# Patient Record
Sex: Male | Born: 1975
Health system: Southern US, Community
[De-identification: ages and names within clinical notes are randomized; demographics above are authoritative.]

## PROBLEM LIST (undated history)

## (undated) DIAGNOSIS — F17200 Nicotine dependence, unspecified, uncomplicated: Secondary | ICD-10-CM

## (undated) DIAGNOSIS — G4733 Obstructive sleep apnea (adult) (pediatric): Secondary | ICD-10-CM

## (undated) DIAGNOSIS — R5383 Other fatigue: Secondary | ICD-10-CM

## (undated) DIAGNOSIS — K219 Gastro-esophageal reflux disease without esophagitis: Secondary | ICD-10-CM

## (undated) DIAGNOSIS — I10 Essential (primary) hypertension: Secondary | ICD-10-CM

## (undated) DIAGNOSIS — R945 Abnormal results of liver function studies: Secondary | ICD-10-CM

## (undated) DIAGNOSIS — E785 Hyperlipidemia, unspecified: Secondary | ICD-10-CM

## (undated) DIAGNOSIS — D649 Anemia, unspecified: Secondary | ICD-10-CM

## (undated) HISTORY — DX: Nicotine dependence, unspecified, uncomplicated: F17.200

## (undated) HISTORY — DX: Anemia, unspecified: D64.9

## (undated) HISTORY — DX: Other fatigue: R53.83

## (undated) HISTORY — DX: Essential (primary) hypertension: I10

## (undated) HISTORY — DX: Gastro-esophageal reflux disease without esophagitis: K21.9

## (undated) HISTORY — DX: Hyperlipidemia, unspecified: E78.5

## (undated) HISTORY — PX: OTHER SURGICAL HISTORY: SHX169

## (undated) HISTORY — DX: Abnormal results of liver function studies: R94.5

## (undated) HISTORY — DX: Obstructive sleep apnea (adult) (pediatric): G47.33

---

## 2015-08-27 DIAGNOSIS — R945 Abnormal results of liver function studies: Secondary | ICD-10-CM | POA: Insufficient documentation

## 2015-08-27 DIAGNOSIS — R7989 Other specified abnormal findings of blood chemistry: Secondary | ICD-10-CM

## 2015-08-27 HISTORY — DX: Other specified abnormal findings of blood chemistry: R79.89

## 2015-08-27 HISTORY — DX: Abnormal results of liver function studies: R94.5

## 2015-09-17 DIAGNOSIS — G4733 Obstructive sleep apnea (adult) (pediatric): Secondary | ICD-10-CM | POA: Insufficient documentation

## 2015-09-17 DIAGNOSIS — Z9989 Dependence on other enabling machines and devices: Secondary | ICD-10-CM

## 2015-09-17 HISTORY — DX: Obstructive sleep apnea (adult) (pediatric): G47.33

## 2016-09-18 DIAGNOSIS — H6123 Impacted cerumen, bilateral: Secondary | ICD-10-CM | POA: Diagnosis not present

## 2016-09-18 DIAGNOSIS — J310 Chronic rhinitis: Secondary | ICD-10-CM | POA: Diagnosis not present

## 2016-09-25 ENCOUNTER — Encounter: Payer: Self-pay | Admitting: Family Medicine

## 2016-09-25 ENCOUNTER — Ambulatory Visit (INDEPENDENT_AMBULATORY_CARE_PROVIDER_SITE_OTHER): Payer: BLUE CROSS/BLUE SHIELD | Admitting: Family Medicine

## 2016-09-25 VITALS — BP 120/80 | HR 100 | Resp 12 | Ht 71.5 in | Wt 258.1 lb

## 2016-09-25 DIAGNOSIS — R739 Hyperglycemia, unspecified: Secondary | ICD-10-CM | POA: Diagnosis not present

## 2016-09-25 DIAGNOSIS — R740 Nonspecific elevation of levels of transaminase and lactic acid dehydrogenase [LDH]: Secondary | ICD-10-CM | POA: Diagnosis not present

## 2016-09-25 DIAGNOSIS — F17200 Nicotine dependence, unspecified, uncomplicated: Secondary | ICD-10-CM

## 2016-09-25 DIAGNOSIS — J309 Allergic rhinitis, unspecified: Secondary | ICD-10-CM | POA: Diagnosis not present

## 2016-09-25 DIAGNOSIS — E782 Mixed hyperlipidemia: Secondary | ICD-10-CM

## 2016-09-25 DIAGNOSIS — K219 Gastro-esophageal reflux disease without esophagitis: Secondary | ICD-10-CM

## 2016-09-25 DIAGNOSIS — I10 Essential (primary) hypertension: Secondary | ICD-10-CM

## 2016-09-25 DIAGNOSIS — R7989 Other specified abnormal findings of blood chemistry: Secondary | ICD-10-CM

## 2016-09-25 DIAGNOSIS — G4733 Obstructive sleep apnea (adult) (pediatric): Secondary | ICD-10-CM

## 2016-09-25 DIAGNOSIS — Z9989 Dependence on other enabling machines and devices: Secondary | ICD-10-CM

## 2016-09-25 DIAGNOSIS — R7401 Elevation of levels of liver transaminase levels: Secondary | ICD-10-CM

## 2016-09-25 DIAGNOSIS — M542 Cervicalgia: Secondary | ICD-10-CM

## 2016-09-25 DIAGNOSIS — M79661 Pain in right lower leg: Secondary | ICD-10-CM | POA: Diagnosis not present

## 2016-09-25 DIAGNOSIS — E049 Nontoxic goiter, unspecified: Secondary | ICD-10-CM

## 2016-09-25 HISTORY — DX: Allergic rhinitis, unspecified: J30.9

## 2016-09-25 HISTORY — DX: Mixed hyperlipidemia: E78.2

## 2016-09-25 HISTORY — DX: Cervicalgia: M54.2

## 2016-09-25 HISTORY — DX: Essential (primary) hypertension: I10

## 2016-09-25 HISTORY — DX: Elevation of levels of liver transaminase levels: R74.01

## 2016-09-25 HISTORY — DX: Morbid (severe) obesity due to excess calories: E66.01

## 2016-09-25 MED ORDER — METOPROLOL SUCCINATE ER 25 MG PO TB24: 25.0000 mg | ORAL_TABLET | Freq: Every day | ORAL | 2 refills | Status: DC

## 2016-09-25 MED ORDER — NICOTINE 21 MG/24HR TD PT24: 21.0000 mg | MEDICATED_PATCH | Freq: Every day | TRANSDERMAL | 1 refills | Status: DC

## 2016-09-25 MED ORDER — FENOFIBRATE MICRONIZED 200 MG PO CAPS: 200.0000 mg | ORAL_CAPSULE | Freq: Every day | ORAL | 0 refills | Status: DC

## 2016-09-25 MED ORDER — AMLODIPINE BESYLATE 5 MG PO TABS: 5.0000 mg | ORAL_TABLET | Freq: Every day | ORAL | 2 refills | Status: DC

## 2016-09-25 NOTE — Progress Notes (Signed)
Pre visit review using our clinic review tool, if applicable. No additional management support is needed unless otherwise documented below in the visit note. 

## 2016-09-25 NOTE — Progress Notes (Signed)
HPI:   [name] is a 41 y.o. male, who is here today to establish care.  Former PCP: Dr Sherryll Burger Last preventive routine visit: 1-2 years ago.  Chronic medical problems: Cervicalgia, GERD, ED,OSA,HTN,HLD, tobacco use disorder.  He has followed with urologist for ED, he takes Cialis 5 mg daily as needed, he takes it very seldom. He has followed up with urologists annually, he has periodic prostate check due to family history of prostate cancer. He is planning on establishing with urologist here in town. He denies dysuria, urinary frequency, or gross hematuria.   Concerns today:   Since yesterday left sided sore throat. He denies fever, chills, body aches. No sick contact. He has history of allergic rhinitis, has followed with ENT, currently he is on Flonase nasal spray.  Right calf pain:  2-3 months of right lateral calf pain usually after 10 min of walking, alleviated by rest. Pain is described as cramp, moderate intensity. He denies numbness, tingling, or cold extremities. He denies lower back pain.   He mentions right cervical pain for the past 2-3 years, exacerbated by falling asleep on the couch with head in certain position,mild,no radiated.He takes Celebrex as needed.   Hyperlipidemia:  Elevated triglycerides. Currently on fenofibrate 200 mg daily and Vascepa 2 g bid. TG 02/2016 were 365. Following a low fat diet: Not consistently,started yesterday again.  He has not noted side effects with medication.  History of elevated transaminases, in March 2017 AST 46 and AST 69. Work up negative for hepatitis A, B, and C. He states that this problem "runs in the family",parents with Hx of hepatitis C. Father has been treated already,mother dies from complications.  Glucose 08/2015 116 (121), he denies history of diabetes.  Hypertension:   Dx 2017. Currently on Amlodipine 5 mg and Metoprolol succinate are 25 mg daily  He wonders if he needs to continue  medications. He is not checking BP at home.  He istaking medications as instructed, no side effects reported.  He has not noted unusual headache, visual changes, exertional chest pain, dyspnea,  focal weakness, or edema.  + Smoker. He tried Chantix about a year ago, resume smoking after completing treatment.  He is interested in trying to quit again.  Hx of OSA with CPAP machine. Last sleep study 08/2015 (Dx) 09/2015 (CPAP titration).   Review of Systems  Constitutional: Negative for activity change, appetite change, fatigue, fever and unexpected weight change.  HENT: Positive for postnasal drip and sore throat. Negative for dental problem, nosebleeds and trouble swallowing.   Eyes: Negative for redness and visual disturbance.  Respiratory: Positive for apnea. Negative for cough, shortness of breath and wheezing.   Cardiovascular: Negative for chest pain, palpitations and leg swelling.  Gastrointestinal: Negative for abdominal pain, nausea and vomiting.       No changes in bowel habits.  Endocrine: Negative for cold intolerance, heat intolerance, polydipsia, polyphagia and polyuria.  Genitourinary: Negative for decreased urine volume, dysuria and hematuria.  Musculoskeletal: Positive for myalgias. Negative for arthralgias.  Skin: Negative for pallor and rash.  Allergic/Immunologic: Positive for environmental allergies.  Neurological: Negative for syncope, weakness, numbness and headaches.  Psychiatric/Behavioral: Negative for confusion. The patient is not nervous/anxious.     No current outpatient prescriptions on file prior to visit.   No current facility-administered medications on file prior to visit.     Past Medical History:  Diagnosis Date  . Abnormal LFTs (liver function tests) 08/27/2015   Hepatitis work-up  negative 08/2015.  Marland Kitchen Anemia    Thalasemia  . Fatigue   . GERD (gastroesophageal reflux disease)   . Hyperlipidemia    dyslipidemia-hyperTG  . Hypertension   .  OSA (obstructive sleep apnea)    last sleep study 09/2015  . Tobacco use disorder    Allergies  Allergen Reactions  . Oxycodone     Family History  Problem Relation Age of Onset  . Cancer Father     prostate  . Hepatitis C Father   . Hepatitis C Mother     Social History   Social History  . Marital status: Married    Spouse name: N/A  . Number of children: N/A  . Years of education: N/A   Social History Main Topics  . Smoking status: Current Every Day Smoker  . Smokeless tobacco: Never Used  . Alcohol use No  . Drug use: No  . Sexual activity: Not Asked   Other Topics Concern  . None   Social History Narrative  . None    Vitals:   09/25/16 1446  BP: 120/80  Pulse: 100  Resp: 12  O2 sat at RA 97% Body mass index is 35.5 kg/m.   Physical Exam  Nursing note and vitals reviewed. Constitutional: He is oriented to person, place, and time. He appears well-developed. No distress.  HENT:  Head: Atraumatic.  Mouth/Throat: Uvula is midline, oropharynx is clear and moist and mucous membranes are normal.  Postnasal drainage.  Eyes: Conjunctivae and EOM are normal. Pupils are equal, round, and reactive to light.  Neck: No tracheal deviation present. Thyromegaly (? Nodular) present. No thyroid mass present.  Cardiovascular: Normal rate and regular rhythm.   No murmur heard. DP and PT pulses present.  Respiratory: Effort normal and breath sounds normal. No respiratory distress.  GI: Soft. He exhibits no mass. There is no hepatomegaly. There is no tenderness.  Musculoskeletal: He exhibits no edema or tenderness.       Cervical back: He exhibits normal range of motion, no tenderness and no bony tenderness.       Right lower leg: He exhibits no tenderness.       Left lower leg: He exhibits no tenderness.  Lymphadenopathy:    He has no cervical adenopathy.  Neurological: He is alert and oriented to person, place, and time. He has normal strength. Gait normal.  Skin:  Skin is warm. No rash noted. No erythema.  Psychiatric: His mood appears anxious. Cognition and memory are normal.  Well groomed, good eye contact.     ASSESSMENT AND PLAN:   Nakia was seen today for establish care.  Diagnoses and all orders for this visit:  Gastroesophageal reflux disease, esophagitis presence not specified  Well controlled. Some side effects of PPI discussed. GERD precautions review. He can try PPI as needed.  Hypertension, essential, benign Adequately controlled. No changes in current management for now. DASH-low salt diet recommended. Eye exam recommended annually. F/U in 5-6 months, before if needed.   -     amLODipine (NORVASC) 5 MG tablet; Take 1 tablet (5 mg total) by mouth daily. -     metoprolol succinate (TOPROL-XL) 25 MG 24 hr tablet; Take 1 tablet (25 mg total) by mouth daily. -     Comprehensive metabolic panel; Future  Right calf pain  We discussed possible etiologies: Muscle cramp,vein disease,PAD. Distal pulses present, because history associated claudication ABI will be arranged. Smoking cessation encouraged. Instructed about warning signs.  -  VAS [place] ABI WITH/WO TBI; Future  Enlarged thyroid gland  ? Nodular thyroid. Asymptomatic. TSH 08/2015 in normal range. Further recommendations would be given according to imaging results.  -     TSH; Future -     US THYROID; Future  Hyperlipemia, mixed  No changes in current management, will follow labs and will give further recommendations accordingly. He is not fasting ,so will come back tomorrow for fasting labs. We discussed some side effects and benefits of current medications. F/U in 6-12 months.  -     fenofibrate micronized (LOFIBRA) 200 MG capsule; Take 1 capsule (200 mg total) by mouth daily before breakfast. -     Comprehensive metabolic panel; Future -     Lipid panel; Future  Elevated transaminase level  ? Fatty liver (NASH) Healthy diet and weight loss  recommended. Further recommendations will be given according to lab results.  -     Comprehensive metabolic panel; Future  OSA on CPAP  According to patient CPAP supplies are guaranteed for about another 4 years, his sleep specialist in [facility] receives information electronically and he gets feedback accordingly. So he prefers to hold on referral to pulmonologist.  Severe obesity (BMI 35.0-39.9) with comorbidity (HCC)  We discussed benefits of wt loss as well as adverse effects of obesity. Consistency with healthy diet and physical activity recommended. Daily brisk walking for 15-30 min as tolerated.  Allergic rhinitis, unspecified seasonality, unspecified trigger  Sore throat he is reporting today could be related to allergies and/or the beginning of her viral pharyngitis. Today examination is otherwise negative.  Continue current management. Follow up with ENT as needed.   Cervicalgia  Chronic. Currently he is asymptomatic. We discussed some side effects of chronic NSAIDs and use, including CV and GI.  Hyperglycemia  He is having labs tomorrow, further recommendations would be given according to lab results. We discussed the importance of healthy lifestyle for primary prevention.  -     Hemoglobin A1c; Future  Tobacco use disorder  We discussed some treatment options, he already failed Chantix. He could try nicotine: Patches or gum. Adverse effects of tobacco use discussed. Nicotine patches 21 mg daily recommended for 7 weeks.   Records reviewed during OV.   Betty G. [place], MD  [facility]. [place] office.

## 2016-09-25 NOTE — Patient Instructions (Signed)
A few things to remember from today's visit:   Right calf pain - Plan: VAS [place] ABI WITH/WO TBI  Hypertension, essential, benign - Plan: amLODipine (NORVASC) 5 MG tablet, metoprolol succinate (TOPROL-XL) 25 MG 24 hr tablet, Comprehensive metabolic panel  Enlarged thyroid gland - Plan: TSH, US THYROID  Hyperlipemia, mixed - Plan: Comprehensive metabolic panel, Lipid panel  Gastroesophageal reflux disease, esophagitis presence not specified  Elevated transaminase level  Blood pressure goal for most people is less than 140/90. Some populations (older than 60) the goal is less than 150/90.  Most recent cardiologists' recommendations recommend blood pressure at or less than 130/80.   Elevated blood pressure increases the risk of strokes, heart and kidney disease, and eye problems. Regular physical activity and a healthy diet (DASH diet) usually help. Low salt diet. Take medications as instructed.  Caution with some over the counter medications as cold medications, dietary products (for weight loss), and Ibuprofen or Aleve (frequent use);all these medications could cause elevation of blood pressure.   Please be sure medication list is accurate. If a new problem present, please set up appointment sooner than planned today.

## 2016-09-26 ENCOUNTER — Other Ambulatory Visit: Payer: BLUE CROSS/BLUE SHIELD

## 2016-09-26 LAB — COMPREHENSIVE METABOLIC PANEL
ALBUMIN: 4.2 g/dL (ref 3.5–5.2)
ALT: 102 U/L — ABNORMAL HIGH (ref 0–53)
AST: 66 U/L — AB (ref 0–37)
Alkaline Phosphatase: 64 U/L (ref 39–117)
BUN: 18 mg/dL (ref 6–23)
CO2: 27 mEq/L (ref 19–32)
Calcium: 9.6 mg/dL (ref 8.4–10.5)
Chloride: 103 mEq/L (ref 96–112)
Creatinine, Ser: 0.98 mg/dL (ref 0.40–1.50)
GFR: 89.25 mL/min (ref >60.00)
Glucose, Bld: 345 mg/dL — ABNORMAL HIGH (ref 70–99)
POTASSIUM: 4.2 meq/L (ref 3.5–5.1)
SODIUM: 137 meq/L (ref 135–145)
Total Bilirubin: 0.5 mg/dL (ref 0.2–1.2)
Total Protein: 6.8 g/dL (ref 6.0–8.3)

## 2016-09-26 LAB — LIPID PANEL
CHOL/HDL RATIO: 8
CHOLESTEROL: 164 mg/dL (ref 0–200)
HDL: 20.1 mg/dL — ABNORMAL LOW (ref >39.00)
Triglycerides: 446 mg/dL — ABNORMAL HIGH (ref 0.0–149.0)

## 2016-09-26 LAB — LDL CHOLESTEROL, DIRECT: Direct LDL: 95 mg/dL

## 2016-09-26 LAB — HEMOGLOBIN A1C: Hgb A1c MFr Bld: 8.8 % — ABNORMAL HIGH (ref 4.6–6.5)

## 2016-09-26 LAB — TSH: TSH: 1.93 u[IU]/mL (ref 0.35–4.50)

## 2016-09-26 NOTE — Addendum Note (Signed)
Addended by: Charna Elizabeth on: 09/26/2016 09:31 AM   Modules accepted: Orders

## 2016-10-03 ENCOUNTER — Other Ambulatory Visit: Payer: Self-pay

## 2016-10-03 MED ORDER — ROSUVASTATIN CALCIUM 20 MG PO TABS: 20.0000 mg | ORAL_TABLET | Freq: Every day | ORAL | 3 refills | Status: DC

## 2016-10-03 MED ORDER — METFORMIN HCL 500 MG PO TABS: ORAL_TABLET | ORAL | 3 refills | Status: DC

## 2016-10-06 ENCOUNTER — Ambulatory Visit
Admission: RE | Admit: 2016-10-06 | Discharge: 2016-10-06 | Disposition: A | Discharge status: Home / Self Care | Payer: BLUE CROSS/BLUE SHIELD | Source: Ambulatory Visit | Attending: Family Medicine | Admitting: Family Medicine

## 2016-10-06 ENCOUNTER — Telehealth: Payer: Self-pay | Admitting: Family Medicine

## 2016-10-06 ENCOUNTER — Other Ambulatory Visit: Payer: Self-pay

## 2016-10-06 DIAGNOSIS — E049 Nontoxic goiter, unspecified: Secondary | ICD-10-CM

## 2016-10-06 DIAGNOSIS — R748 Abnormal levels of other serum enzymes: Secondary | ICD-10-CM

## 2016-10-06 DIAGNOSIS — E0520 Thyrotoxicosis with toxic multinodular goiter without thyrotoxic crisis or storm: Secondary | ICD-10-CM | POA: Diagnosis not present

## 2016-10-06 NOTE — Addendum Note (Signed)
Addended by: Marcell AngerSELF, Carlei Huang E on: 10/06/2016 12:36 PM   Modules accepted: Orders

## 2016-10-06 NOTE — Telephone Encounter (Signed)
° ° ° °  Pt would like a call back concerning his labs. He wants to speak with someone I told him the labs was mail to his home but he is requesting a call back to discuss

## 2016-10-06 NOTE — Telephone Encounter (Signed)
Informed patient of results and patient verbalized understanding.  

## 2016-10-21 ENCOUNTER — Other Ambulatory Visit: Payer: Self-pay | Admitting: Family Medicine

## 2016-10-21 DIAGNOSIS — M79661 Pain in right lower leg: Secondary | ICD-10-CM

## 2016-10-24 ENCOUNTER — Inpatient Hospital Stay (HOSPITAL_COMMUNITY): Admission: RE | Admit: 2016-10-24 | Payer: BLUE CROSS/BLUE SHIELD | Source: Ambulatory Visit

## 2016-11-04 DIAGNOSIS — E119 Type 2 diabetes mellitus without complications: Secondary | ICD-10-CM | POA: Diagnosis not present

## 2016-11-04 DIAGNOSIS — K219 Gastro-esophageal reflux disease without esophagitis: Secondary | ICD-10-CM | POA: Diagnosis not present

## 2016-11-04 DIAGNOSIS — Z1389 Encounter for screening for other disorder: Secondary | ICD-10-CM | POA: Diagnosis not present

## 2016-11-04 DIAGNOSIS — I10 Essential (primary) hypertension: Secondary | ICD-10-CM | POA: Diagnosis not present

## 2016-11-04 DIAGNOSIS — E782 Mixed hyperlipidemia: Secondary | ICD-10-CM | POA: Diagnosis not present

## 2016-11-10 ENCOUNTER — Other Ambulatory Visit: Payer: Self-pay | Admitting: *Deleted

## 2016-11-10 MED ORDER — ROSUVASTATIN CALCIUM 20 MG PO TABS: 20.0000 mg | ORAL_TABLET | Freq: Every day | ORAL | 1 refills | Status: DC

## 2016-11-10 MED ORDER — METFORMIN HCL 500 MG PO TABS: ORAL_TABLET | ORAL | 1 refills | Status: DC

## 2016-11-10 NOTE — Telephone Encounter (Signed)
Rx done. 

## 2016-11-17 ENCOUNTER — Other Ambulatory Visit: Payer: BLUE CROSS/BLUE SHIELD

## 2016-12-22 DIAGNOSIS — E119 Type 2 diabetes mellitus without complications: Secondary | ICD-10-CM | POA: Diagnosis not present

## 2016-12-22 DIAGNOSIS — Z0000 Encounter for general adult medical examination without abnormal findings: Secondary | ICD-10-CM | POA: Diagnosis not present

## 2016-12-22 DIAGNOSIS — E782 Mixed hyperlipidemia: Secondary | ICD-10-CM | POA: Diagnosis not present

## 2017-01-20 DIAGNOSIS — D72829 Elevated white blood cell count, unspecified: Secondary | ICD-10-CM | POA: Diagnosis not present

## 2017-01-20 DIAGNOSIS — Z23 Encounter for immunization: Secondary | ICD-10-CM | POA: Diagnosis not present

## 2017-01-20 DIAGNOSIS — Z0000 Encounter for general adult medical examination without abnormal findings: Secondary | ICD-10-CM | POA: Diagnosis not present

## 2017-02-17 ENCOUNTER — Telehealth: Payer: Self-pay | Admitting: Oncology

## 2017-02-17 NOTE — Telephone Encounter (Signed)
Appt has been scheduled for the pt to see Dr. Clelia Croft on 9/20 at 11am. Pt aware to arrive 30 minutes early.

## 2017-02-19 ENCOUNTER — Telehealth: Payer: Self-pay | Admitting: Oncology

## 2017-02-19 ENCOUNTER — Ambulatory Visit (HOSPITAL_BASED_OUTPATIENT_CLINIC_OR_DEPARTMENT_OTHER): Payer: BLUE CROSS/BLUE SHIELD

## 2017-02-19 ENCOUNTER — Ambulatory Visit (HOSPITAL_BASED_OUTPATIENT_CLINIC_OR_DEPARTMENT_OTHER): Payer: BLUE CROSS/BLUE SHIELD | Admitting: Oncology

## 2017-02-19 VITALS — BP 133/85 | HR 89 | Temp 98.9°F | Resp 17 | Ht 71.5 in | Wt 228.0 lb

## 2017-02-19 DIAGNOSIS — D72829 Elevated white blood cell count, unspecified: Secondary | ICD-10-CM

## 2017-02-19 LAB — CBC WITH DIFFERENTIAL/PLATELET
BASO%: 0.3 % (ref 0.0–2.0)
BASOS ABS: 0 10*3/uL (ref 0.0–0.1)
EOS%: 2.3 % (ref 0.0–7.0)
Eosinophils Absolute: 0.3 10*3/uL (ref 0.0–0.5)
HEMATOCRIT: 40.6 % (ref 38.4–49.9)
HGB: 13.4 g/dL (ref 13.0–17.1)
LYMPH%: 22.5 % (ref 14.0–49.0)
MCH: 20.2 pg — AB (ref 27.2–33.4)
MCHC: 33 g/dL (ref 32.0–36.0)
MCV: 61.1 fL — AB (ref 79.3–98.0)
MONO#: 1.3 10*3/uL — AB (ref 0.1–0.9)
MONO%: 9.7 % (ref 0.0–14.0)
NEUT#: 8.6 10*3/uL — ABNORMAL HIGH (ref 1.5–6.5)
NEUT%: 65.2 % (ref 39.0–75.0)
PLATELETS: 205 10*3/uL (ref 140–400)
RBC: 6.65 10*6/uL — AB (ref 4.20–5.82)
RDW: 17.1 % — ABNORMAL HIGH (ref 11.0–14.6)
WBC: 13.1 10*3/uL — ABNORMAL HIGH (ref 4.0–10.3)
lymph#: 3 10*3/uL (ref 0.9–3.3)
nRBC: 0 % (ref 0–0)

## 2017-02-19 NOTE — Telephone Encounter (Signed)
Scheduled lab add on for today - no additional appts scheduled per 9/20 los -

## 2017-02-19 NOTE — Progress Notes (Signed)
Reason for Referral: Leukocytosis.   HPI: 41 year old gentleman native of [place] currently living in this area the last 8 months. He is a history of obesity, thalassemia and obstructive sleep apnea. He has also history of smoking for the last 20 years close to a pack a day. He currently works in [employer] and fairly busy in his job. He was evaluated by his primary care physician and laboratory testing at the time showed an elevated white cell count close to 12,000. His differential was normal at bedtime. His hemoglobin was normal with low MCV. This blood count was normal. He reports no specific symptoms at this time including no fevers or chills or sweats. He denied any lymphadenopathy. He denied any abdominal pain or loss of appetite. He is losing weight intentionally. He remains active attending to her activities of daily living.  He does not report any headaches, blurry vision, syncope or seizures. He does not report any fevers or chills or sweats. He does not report any cough, wheezing or hemoptysis. He does not report any nausea, vomiting or abdominal pain. He does not report any frequency urgency or hesitancy. He does not report any skeletal complaints. Remaining review of systems unremarkable.   Past Medical History:  Diagnosis Date  . Abnormal LFTs (liver function tests) 08/27/2015   Hepatitis work-up negative 08/2015.  Marland Kitchen Anemia    Thalasemia  . Fatigue   . GERD (gastroesophageal reflux disease)   . Hyperlipidemia    dyslipidemia-hyperTG  . Hypertension   . OSA (obstructive sleep apnea)    last sleep study 09/2015  . Tobacco use disorder   :  No past surgical history on file.:   Current Outpatient Prescriptions:  .  amLODipine (NORVASC) 5 MG tablet, Take 1 tablet (5 mg total) by mouth daily., Disp: 90 tablet, Rfl: 2 .  celecoxib (CELEBREX) 200 MG capsule, Take 200 mg by mouth daily as needed. Take 200 mg by mouth daily as needed., Disp: , Rfl: 9 .  CIALIS 5 MG  tablet, TAKE 1 TABLET BY MOUTH EVERY DAY . SEPARATE AT LEAST 4 HOURS FROM ALPHA BLOCKERS, Disp: , Rfl: 4 .  fluticasone (FLONASE) 50 MCG/ACT nasal spray, INSTILL 2 SPRAYS INTO EACH NOSTRIL ONCE EVERY NIGHT, Disp: , Rfl: 5 .  metFORMIN (GLUCOPHAGE) 500 MG tablet, Take 1 tablet by mouth with breakfast and take 2 tablets by mouth at supper., Disp: 270 tablet, Rfl: 1 .  metoprolol succinate (TOPROL-XL) 25 MG 24 hr tablet, Take 1 tablet (25 mg total) by mouth daily., Disp: 90 tablet, Rfl: 2 .  nicotine (NICODERM CQ - DOSED IN MG/24 HOURS) 21 mg/24hr patch, Place 1 patch (21 mg total) onto the skin daily., Disp: 28 patch, Rfl: 1 .  omeprazole (PRILOSEC) 40 MG capsule, Take 40 mg by mouth daily as needed. Take 40 mg by mouth daily as needed., Disp: , Rfl: 2 .  rosuvastatin (CRESTOR) 20 MG tablet, Take 1 tablet (20 mg total) by mouth daily., Disp: 90 tablet, Rfl: 1 .  VASCEPA 1 g CAPS, Take 2 capsules by mouth 2 (two) times daily. Take 2 capsules by mouth 2 (two) times daily., Disp: , Rfl: 3:  Allergies  Allergen Reactions  . Oxycodone   :  Family History  Problem Relation Age of Onset  . Cancer Father        prostate  . Hepatitis C Father   . Hepatitis C Mother   :  Social History   Social History  . Marital  status: Married    Spouse name: N/A  . Number of children: N/A  . Years of education: N/A   Occupational History  . Not on file.   Social History Main Topics  . Smoking status: Current Every Day Smoker  . Smokeless tobacco: Never Used  . Alcohol use No  . Drug use: No  . Sexual activity: Not on file   Other Topics Concern  . Not on file   Social History Narrative  . No narrative on file  :  Pertinent items are noted in HPI.  Exam: Blood pressure 133/85, pulse 89, temperature 98.9 F (37.2 C), temperature source Oral, resp. rate 17, height 5' 11.5" (1.816 m), weight 228 lb (103.4 kg), SpO2 100 %.  ECOG 0  General appearance: alert and cooperative appeared without  distress. Throat: No oral thrush or ulcers. Neck: no adenopathy.  Back: negative Resp: clear to auscultation bilaterally Chest wall: no tenderness Cardio: regular rate and rhythm, S1, S2 normal, no murmur, click, rub or gallop GI: soft, non-tender; bowel sounds normal; no masses,  no organomegaly daily. Extremities: extremities normal, atraumatic, no cyanosis or edema Pulses: 2+ and symmetric Skin: Skin color, texture, turgor normal. No rashes or lesions Lymph examination: Showed no lymphadenopathy.   Recent Labs  02/19/17 1154  WBC 13.1*  HGB 13.4  HCT 40.6  PLT 205     Assessment and Plan:   41 year old gentleman with the following issues:  1. Leukocytosis: His CBC was repeated today and showed a white cell count of 13,000. His differential is within normal range. His hemoglobin was 13.4 with a low MCV.  The differential diagnosis was discussed today. His leukocytosis appears to be reactive in nature. This could be related to smoking as the most likely cause. Other causes includes recent stress, infection among others.  Hematological disorder considered less likely. Chronic myelogenous leukemia will need to be ruled out which we will do so today. Other Myeloproliferative disorder is also considered less likely.  If his CML evaluation showed no evidence of BCR/ABL translocation, no further testing is needed at this time. There is no need for any imaging studies or a bone marrow biopsy. His white cell count elevation is rather mild and chronic in nature likely related to smoking.  2. Microcytosis: Appear to be related to thalassemia. No anemia noted. He is aware of these findings and implication about future children.  3. Smoking cessation: I advised him about the importance of smoking cessation not only because of his leukocytosis but other health implications.  4. Follow-up: If his evaluation showed no hematological abnormalities, no hematology follow-up is needed.

## 2017-02-24 DIAGNOSIS — IMO0001 Reserved for inherently not codable concepts without codable children: Secondary | ICD-10-CM | POA: Insufficient documentation

## 2017-02-24 DIAGNOSIS — E1165 Type 2 diabetes mellitus with hyperglycemia: Secondary | ICD-10-CM

## 2017-02-24 HISTORY — DX: Reserved for inherently not codable concepts without codable children: IMO0001

## 2017-02-24 NOTE — Progress Notes (Deleted)
HPI:   [name] is a 41 y.o. male, who is here today to follow on some chronic medical problems.  He was last seen on 09/25/16.  Hyperlipidemia:  Currently on Fenofibrate 200 mg daily. Following a low fat diet: ***.  *** has not noted side effects with medication.  Lab Results  Component Value Date   CHOL 164 09/26/2016   HDL 20.10 (L) 09/26/2016   LDLDIRECT 95.0 09/26/2016   TRIG (H) 09/26/2016    446.0 Triglyceride is over 400; calculations on Lipids are invalid.   CHOLHDL 8 09/26/2016    Elevated transaminases.  Lab Results  Component Value Date   ALT 102 (H) 09/26/2016   AST 66 (H) 09/26/2016   ALKPHOS 64 09/26/2016   BILITOT 0.5 09/26/2016    HTN: Dx in 2017. On Amlodipine 5 mg and Metoprolol Succinate 25 mg daily. Home BP's: *** Last eye exam: *** Denies severe/frequent headache, visual changes, chest pain, dyspnea, palpitation, claudication, focal weakness, or edema.  OSA on CPAP.  He was c/o calf pain last OV, missed appt for ABI.  Diabetes Mellitus II:   Dx 08/2016. Metformin 500 mg bid recommended.  Checking BS's : *** Hypoglycemia:  *** is tolerating medications well. *** denies abdominal pain, nausea, vomiting, polydipsia, polyuria, or polyphagia. ***numbness, tingling, or burning.   Lab Results  Component Value Date   CREATININE 0.98 09/26/2016   BUN 18 09/26/2016   NA 137 09/26/2016   K 4.2 09/26/2016   CL 103 09/26/2016   CO2 27 09/26/2016    Lab Results  Component Value Date   HGBA1C 8.8 (H) 09/26/2016    Concerns today: ***   Review of Systems    Current Outpatient Prescriptions on File Prior to Visit  Medication Sig Dispense Refill  . amLODipine (NORVASC) 5 MG tablet Take 1 tablet (5 mg total) by mouth daily. 90 tablet 2  . celecoxib (CELEBREX) 200 MG capsule Take 200 mg by mouth daily as needed. Take 200 mg by mouth daily as needed.  9  . CIALIS 5 MG tablet TAKE 1 TABLET BY MOUTH EVERY DAY .  SEPARATE AT LEAST 4 HOURS FROM ALPHA BLOCKERS  4  . fluticasone (FLONASE) 50 MCG/ACT nasal spray INSTILL 2 SPRAYS INTO EACH NOSTRIL ONCE EVERY NIGHT  5  . metFORMIN (GLUCOPHAGE) 500 MG tablet Take 1 tablet by mouth with breakfast and take 2 tablets by mouth at supper. 270 tablet 1  . metoprolol succinate (TOPROL-XL) 25 MG 24 hr tablet Take 1 tablet (25 mg total) by mouth daily. 90 tablet 2  . nicotine (NICODERM CQ - DOSED IN MG/24 HOURS) 21 mg/24hr patch Place 1 patch (21 mg total) onto the skin daily. 28 patch 1  . omeprazole (PRILOSEC) 40 MG capsule Take 40 mg by mouth daily as needed. Take 40 mg by mouth daily as needed.  2  . rosuvastatin (CRESTOR) 20 MG tablet Take 1 tablet (20 mg total) by mouth daily. 90 tablet 1  . VASCEPA 1 g CAPS Take 2 capsules by mouth 2 (two) times daily. Take 2 capsules by mouth 2 (two) times daily.  3   No current facility-administered medications on file prior to visit.      Past Medical History:  Diagnosis Date  . Abnormal LFTs (liver function tests) 08/27/2015   Hepatitis work-up negative 08/2015.  Marland Kitchen Anemia    Thalasemia  . Fatigue   . GERD (gastroesophageal reflux disease)   . Hyperlipidemia  dyslipidemia-hyperTG  . Hypertension   . OSA (obstructive sleep apnea)    last sleep study 09/2015  . Tobacco use disorder    Allergies  Allergen Reactions  . Oxycodone     Social History   Social History  . Marital status: Married    Spouse name: N/A  . Number of children: N/A  . Years of education: N/A   Social History Main Topics  . Smoking status: Current Every Day Smoker  . Smokeless tobacco: Never Used  . Alcohol use No  . Drug use: No  . Sexual activity: Not on file   Other Topics Concern  . Not on file   Social History Narrative  . No narrative on file    There were no vitals filed for this visit. There is no height or weight on file to calculate BMI.      Physical Exam    ASSESSMENT AND PLAN:     Diagnoses and all  orders for this visit:  Elevated transaminase level  Hypertension, essential, benign  Severe obesity (BMI 35.0-39.9) with comorbidity (HCC)  Hyperlipemia, mixed  Uncontrolled type 2 diabetes mellitus without complication, without long-term current use of insulin [facility])           -[name] was advised to return sooner than planned today if new concerns arise.       Gerald Barrett G. [place], MD  [facility]. [place] office.

## 2017-02-25 ENCOUNTER — Ambulatory Visit: Payer: BLUE CROSS/BLUE SHIELD | Admitting: Family Medicine

## 2017-02-25 DIAGNOSIS — Z0289 Encounter for other administrative examinations: Secondary | ICD-10-CM

## 2017-02-26 ENCOUNTER — Encounter: Payer: Self-pay | Admitting: *Deleted

## 2017-04-01 DIAGNOSIS — R05 Cough: Secondary | ICD-10-CM | POA: Diagnosis not present

## 2017-04-01 DIAGNOSIS — J209 Acute bronchitis, unspecified: Secondary | ICD-10-CM | POA: Diagnosis not present

## 2017-04-01 DIAGNOSIS — L0390 Cellulitis, unspecified: Secondary | ICD-10-CM | POA: Diagnosis not present

## 2017-04-01 DIAGNOSIS — L02416 Cutaneous abscess of left lower limb: Secondary | ICD-10-CM | POA: Diagnosis not present

## 2017-07-01 ENCOUNTER — Other Ambulatory Visit: Payer: Self-pay | Admitting: *Deleted

## 2017-07-01 MED ORDER — METFORMIN HCL 500 MG PO TABS: ORAL_TABLET | ORAL | 1 refills | Status: DC

## 2017-07-03 ENCOUNTER — Other Ambulatory Visit: Payer: Self-pay | Admitting: Family Medicine

## 2017-08-12 DIAGNOSIS — I10 Essential (primary) hypertension: Secondary | ICD-10-CM | POA: Diagnosis not present

## 2017-08-12 DIAGNOSIS — Z1389 Encounter for screening for other disorder: Secondary | ICD-10-CM | POA: Diagnosis not present

## 2017-08-12 DIAGNOSIS — D72829 Elevated white blood cell count, unspecified: Secondary | ICD-10-CM | POA: Diagnosis not present

## 2017-08-12 DIAGNOSIS — E119 Type 2 diabetes mellitus without complications: Secondary | ICD-10-CM | POA: Diagnosis not present

## 2017-08-12 DIAGNOSIS — F17210 Nicotine dependence, cigarettes, uncomplicated: Secondary | ICD-10-CM | POA: Diagnosis not present

## 2017-10-05 ENCOUNTER — Other Ambulatory Visit: Payer: Self-pay | Admitting: Family Medicine

## 2018-01-04 ENCOUNTER — Other Ambulatory Visit: Payer: Self-pay | Admitting: Family Medicine

## 2018-01-27 DIAGNOSIS — I10 Essential (primary) hypertension: Secondary | ICD-10-CM | POA: Diagnosis not present

## 2018-01-27 DIAGNOSIS — Z0000 Encounter for general adult medical examination without abnormal findings: Secondary | ICD-10-CM | POA: Diagnosis not present

## 2018-01-27 DIAGNOSIS — G4733 Obstructive sleep apnea (adult) (pediatric): Secondary | ICD-10-CM | POA: Diagnosis not present

## 2018-01-27 DIAGNOSIS — E1169 Type 2 diabetes mellitus with other specified complication: Secondary | ICD-10-CM | POA: Diagnosis not present

## 2018-01-27 DIAGNOSIS — E782 Mixed hyperlipidemia: Secondary | ICD-10-CM | POA: Diagnosis not present

## 2018-01-27 DIAGNOSIS — Z1389 Encounter for screening for other disorder: Secondary | ICD-10-CM | POA: Diagnosis not present

## 2018-03-18 DIAGNOSIS — L810 Postinflammatory hyperpigmentation: Secondary | ICD-10-CM | POA: Diagnosis not present

## 2018-03-18 DIAGNOSIS — D171 Benign lipomatous neoplasm of skin and subcutaneous tissue of trunk: Secondary | ICD-10-CM | POA: Diagnosis not present

## 2018-03-18 DIAGNOSIS — L304 Erythema intertrigo: Secondary | ICD-10-CM | POA: Diagnosis not present

## 2018-03-18 DIAGNOSIS — L723 Sebaceous cyst: Secondary | ICD-10-CM | POA: Diagnosis not present

## 2018-07-02 ENCOUNTER — Other Ambulatory Visit: Payer: Self-pay | Admitting: *Deleted

## 2018-07-02 ENCOUNTER — Other Ambulatory Visit: Payer: Self-pay | Admitting: Family Medicine

## 2018-07-26 DIAGNOSIS — J0140 Acute pansinusitis, unspecified: Secondary | ICD-10-CM | POA: Diagnosis not present

## 2018-07-29 DIAGNOSIS — G4733 Obstructive sleep apnea (adult) (pediatric): Secondary | ICD-10-CM | POA: Diagnosis not present

## 2018-08-03 DIAGNOSIS — K219 Gastro-esophageal reflux disease without esophagitis: Secondary | ICD-10-CM | POA: Diagnosis not present

## 2018-08-03 DIAGNOSIS — E782 Mixed hyperlipidemia: Secondary | ICD-10-CM | POA: Diagnosis not present

## 2018-08-03 DIAGNOSIS — E1169 Type 2 diabetes mellitus with other specified complication: Secondary | ICD-10-CM | POA: Diagnosis not present

## 2018-08-03 DIAGNOSIS — G4733 Obstructive sleep apnea (adult) (pediatric): Secondary | ICD-10-CM | POA: Diagnosis not present

## 2018-08-03 DIAGNOSIS — I10 Essential (primary) hypertension: Secondary | ICD-10-CM | POA: Diagnosis not present

## 2018-09-07 ENCOUNTER — Telehealth: Payer: Self-pay | Admitting: *Deleted

## 2018-09-07 NOTE — Telephone Encounter (Signed)
Patient has not been seen 08/2016 for DM. Called patient to schedule DM f/u appointment. No answer. LVM.

## 2018-09-21 DIAGNOSIS — Z3141 Encounter for fertility testing: Secondary | ICD-10-CM | POA: Diagnosis not present

## 2018-09-25 ENCOUNTER — Other Ambulatory Visit: Payer: Self-pay | Admitting: Family Medicine

## 2018-09-27 NOTE — Telephone Encounter (Signed)
It seems like I have not seen this pt since 2018. Thanks, BJ

## 2018-09-27 NOTE — Telephone Encounter (Signed)
Left message to schedule virtual visit with PCP for follow-up for medication refills.

## 2018-10-05 DIAGNOSIS — M546 Pain in thoracic spine: Secondary | ICD-10-CM | POA: Diagnosis not present

## 2018-10-05 DIAGNOSIS — M25512 Pain in left shoulder: Secondary | ICD-10-CM | POA: Diagnosis not present

## 2018-10-13 DIAGNOSIS — Z3141 Encounter for fertility testing: Secondary | ICD-10-CM | POA: Diagnosis not present

## 2018-11-04 DIAGNOSIS — R5383 Other fatigue: Secondary | ICD-10-CM | POA: Diagnosis not present

## 2019-03-26 ENCOUNTER — Encounter (INDEPENDENT_AMBULATORY_CARE_PROVIDER_SITE_OTHER): Payer: Self-pay

## 2019-08-04 ENCOUNTER — Ambulatory Visit: Payer: BLUE CROSS/BLUE SHIELD | Attending: Family

## 2019-08-04 DIAGNOSIS — Z23 Encounter for immunization: Secondary | ICD-10-CM | POA: Insufficient documentation

## 2019-08-04 NOTE — Progress Notes (Signed)
   Covid-19 Vaccination Clinic  Name:  Gerald Barrett    MRN: [id number] DOB: 11-Feb-1976  08/04/2019  [name] was observed post Covid-19 immunization for 15 minutes without incident. He was provided with Vaccine Information Sheet and instruction to access the V-Safe system.   [name] was instructed to call 911 with any severe reactions post vaccine: Marland Kitchen Difficulty breathing  . Swelling of face and throat  . A fast heartbeat  . A bad rash all over body  . Dizziness and weakness   Immunizations Administered    Name Date Dose VIS Date Route   Moderna COVID-19 Vaccine 08/04/2019  1:06 PM 0.5 mL 05/03/2019 Intramuscular   Manufacturer: Moderna   Lot: [id number]   NDC: 42395-320-23

## 2019-08-09 DIAGNOSIS — I10 Essential (primary) hypertension: Secondary | ICD-10-CM | POA: Diagnosis not present

## 2019-08-09 DIAGNOSIS — G4733 Obstructive sleep apnea (adult) (pediatric): Secondary | ICD-10-CM | POA: Diagnosis not present

## 2019-08-09 DIAGNOSIS — E782 Mixed hyperlipidemia: Secondary | ICD-10-CM | POA: Diagnosis not present

## 2019-08-09 DIAGNOSIS — E119 Type 2 diabetes mellitus without complications: Secondary | ICD-10-CM | POA: Diagnosis not present

## 2019-08-09 DIAGNOSIS — E1169 Type 2 diabetes mellitus with other specified complication: Secondary | ICD-10-CM | POA: Diagnosis not present

## 2019-08-12 DIAGNOSIS — B974 Respiratory syncytial virus as the cause of diseases classified elsewhere: Secondary | ICD-10-CM | POA: Diagnosis not present

## 2019-08-12 DIAGNOSIS — J101 Influenza due to other identified influenza virus with other respiratory manifestations: Secondary | ICD-10-CM | POA: Diagnosis not present

## 2019-08-12 DIAGNOSIS — Z20828 Contact with and (suspected) exposure to other viral communicable diseases: Secondary | ICD-10-CM | POA: Diagnosis not present

## 2019-08-12 DIAGNOSIS — U071 COVID-19: Secondary | ICD-10-CM | POA: Diagnosis not present

## 2019-08-25 DIAGNOSIS — N529 Male erectile dysfunction, unspecified: Secondary | ICD-10-CM | POA: Diagnosis not present

## 2019-09-06 ENCOUNTER — Ambulatory Visit: Payer: Self-pay | Attending: Family

## 2019-09-06 DIAGNOSIS — Z23 Encounter for immunization: Secondary | ICD-10-CM

## 2019-09-06 NOTE — Progress Notes (Signed)
   Covid-19 Vaccination Clinic  Name:  Gerald Barrett    MRN: [id number] DOB: [birth year]  09/06/2019  [name] was observed post Covid-19 immunization for 15 minutes without incident. He was provided with Vaccine Information Sheet and instruction to access the V-Safe system.   Mr. Rounds was instructed to call 911 with any severe reactions post vaccine: Marland Kitchen Difficulty breathing  . Swelling of face and throat  . A fast heartbeat  . A bad rash all over body  . Dizziness and weakness   Immunizations Administered    Name Date Dose VIS Date Route   Moderna COVID-19 Vaccine 09/06/2019 12:48 PM 0.5 mL 05/03/2019 Intramuscular   Manufacturer: Moderna   Lot: [id number]   NDC: 75883-254-98

## 2019-10-18 ENCOUNTER — Other Ambulatory Visit: Payer: Self-pay

## 2019-10-19 ENCOUNTER — Encounter: Payer: Self-pay | Admitting: Cardiology

## 2019-10-19 ENCOUNTER — Other Ambulatory Visit: Payer: Self-pay

## 2019-10-19 ENCOUNTER — Ambulatory Visit: Payer: BC Managed Care – PPO | Admitting: Cardiology

## 2019-10-19 VITALS — BP 122/90 | HR 94 | Ht 71.5 in | Wt 251.0 lb

## 2019-10-19 DIAGNOSIS — F17200 Nicotine dependence, unspecified, uncomplicated: Secondary | ICD-10-CM

## 2019-10-19 DIAGNOSIS — I10 Essential (primary) hypertension: Secondary | ICD-10-CM | POA: Diagnosis not present

## 2019-10-19 DIAGNOSIS — E782 Mixed hyperlipidemia: Secondary | ICD-10-CM

## 2019-10-19 NOTE — Patient Instructions (Signed)
Medication Instructions:  Your physician recommends that you continue on your current medications as directed. Please refer to the Current Medication list given to you today.  *If you need a refill on your cardiac medications before your next appointment, please call your pharmacy*   Lab Work: None. If you have labs (blood work) drawn today and your tests are completely normal, you will receive your results only by: . MyChart Message (if you have MyChart) OR . A paper copy in the mail If you have any lab test that is abnormal or we need to change your treatment, we will call you to review the results.   Testing/Procedures: Your physician has requested that you have an echocardiogram. Echocardiography is a painless test that uses sound waves to create images of your heart. It provides your doctor with information about the size and shape of your heart and how well your heart's chambers and valves are working. This procedure takes approximately one hour. There are no restrictions for this procedure.     Follow-Up: At [facility], you and your health needs are our priority.  As part of our continuing mission to provide you with exceptional heart care, we have created designated Provider Care Teams.  These Care Teams include your primary Cardiologist (physician) and Advanced Practice Providers (APPs -  Physician Assistants and Nurse Practitioners) who all work together to provide you with the care you need, when you need it.  We recommend signing up for the patient portal called "MyChart".  Sign up information is provided on this After Visit Summary.  MyChart is used to connect with patients for Virtual Visits (Telemedicine).  Patients are able to view lab/test results, encounter notes, upcoming appointments, etc.  Non-urgent messages can be sent to your provider as well.   To learn more about what you can do with MyChart, go to [email or web address].    Your next appointment:   3  month(s)  The format for your next appointment:   In Person  Provider:   Robert Krasowski, MD   Other Instructions   Echocardiogram An echocardiogram is a procedure that uses painless sound waves (ultrasound) to produce an image of the heart. Images from an echocardiogram can provide important information about:  Signs of coronary artery disease (CAD).  Aneurysm detection. An aneurysm is a weak or damaged part of an artery wall that bulges out from the normal force of blood pumping through the body.  Heart size and shape. Changes in the size or shape of the heart can be associated with certain conditions, including heart failure, aneurysm, and CAD.  Heart muscle function.  Heart valve function.  Signs of a past heart attack.  Fluid buildup around the heart.  Thickening of the heart muscle.  A tumor or infectious growth around the heart valves. Tell a health care provider about:  Any allergies you have.  All medicines you are taking, including vitamins, herbs, eye drops, creams, and over-the-counter medicines.  Any blood disorders you have.  Any surgeries you have had.  Any medical conditions you have.  Whether you are pregnant or may be pregnant. What are the risks? Generally, this is a safe procedure. However, problems may occur, including:  Allergic reaction to dye (contrast) that may be used during the procedure. What happens before the procedure? No specific preparation is needed. You may eat and drink normally. What happens during the procedure?   An IV tube may be inserted into one of your veins.  You may receive   contrast through this tube. A contrast is an injection that improves the quality of the pictures from your heart.  A gel will be applied to your chest.  A wand-like tool (transducer) will be moved over your chest. The gel will help to transmit the sound waves from the transducer.  The sound waves will harmlessly bounce off of your heart to  allow the heart images to be captured in real-time motion. The images will be recorded on a computer. The procedure may vary among health care providers and hospitals. What happens after the procedure?  You may return to your normal, everyday life, including diet, activities, and medicines, unless your health care provider tells you not to do that. Summary  An echocardiogram is a procedure that uses painless sound waves (ultrasound) to produce an image of the heart.  Images from an echocardiogram can provide important information about the size and shape of your heart, heart muscle function, heart valve function, and fluid buildup around your heart.  You do not need to do anything to prepare before this procedure. You may eat and drink normally.  After the echocardiogram is completed, you may return to your normal, everyday life, unless your health care provider tells you not to do that. This information is not intended to replace advice given to you by your health care provider. Make sure you discuss any questions you have with your health care provider. Document Revised: 09/09/2018 Document Reviewed: 06/21/2016 Elsevier Patient Education  2020 Elsevier Inc.   

## 2019-10-19 NOTE — Progress Notes (Signed)
Cardiology Consultation:    Date:  10/19/2019   ID:  Gerald Barrett, DOB 10/04/1975, MRN [id number]  PCP:  Wenda Low, MD  Cardiologist:  Jenne Campus, MD   Referring MD: Wenda Low, MD   Chief Complaint  Patient presents with  . New Patient (Initial Visit)  I would like to be checked out  History of Present Illness:    Gerald Barrett is a 44 y.o. male who is being seen today for the evaluation of multiple risk factors for coronary disease at the request of Wenda Low, MD.  I met this gentleman last week when I saw his wife.  He is from [place] and he was interpreting for his wife.  We started talking about multiple risk factors for coronary artery disease and he decided to come to be establish as a patient to talk about those risks.  He denies have any chest pain tightness squeezing pressure burning chest.  He does have some exertional shortness of breath.  But he does have multiple risk factors and does include smoking which is still ongoing.  He also have diabetes which is fairly controlled.  He does have essential hypertension.  He does not exercise on the regular basis.  Therefore, we spent majority of visit talking about risk factors modification that he can take to prevent him to have problem in the future. He does have family history of premature coronary artery disease Risk factors are mentioning above. Luckily it looks like he does not have any symptoms right now.  But at the same time he does not exercise on the regular basis.  Past Medical History:  Diagnosis Date  . Abnormal LFTs (liver function tests) 08/27/2015   Hepatitis work-up negative 08/2015.  Marland Kitchen Allergic rhinitis 09/25/2016  . Anemia    Thalasemia  . Cervicalgia 09/25/2016  . Diabetes mellitus type 2, uncontrolled, without complications 01/31/5175  . Elevated transaminase level 09/25/2016  . Fatigue   . GERD (gastroesophageal reflux disease)   . Hyperlipemia, mixed 09/25/2016  . Hyperlipidemia    dyslipidemia-hyperTG  . Hypertension   . Hypertension, essential, benign 09/25/2016  . OSA (obstructive sleep apnea)    last sleep study 09/2015  . OSA on CPAP 09/17/2015  . Severe obesity (BMI 35.0-39.9) with comorbidity ([place]) 09/25/2016  . Tobacco use disorder     History reviewed. No pertinent surgical history.  Current Medications: Current Meds  Medication Sig  . amLODipine-benazepril (LOTREL) 5-10 MG capsule Take 1 capsule by mouth daily.  . celecoxib (CELEBREX) 200 MG capsule Take 200 mg by mouth daily as needed. Take 200 mg by mouth daily as needed.  Marland Kitchen CIALIS 5 MG tablet TAKE 1 TABLET BY MOUTH EVERY DAY . SEPARATE AT LEAST 4 HOURS FROM ALPHA BLOCKERS  . FLUoxetine (PROZAC) 10 MG capsule Take 10 mg by mouth daily.  . metFORMIN (GLUCOPHAGE) 500 MG tablet TAKE 1 TABLET BY MOUTH WITH BREAKFAST AND TAKE 2 TABLETS BY MOUTH AT SUPPER.  Marland Kitchen omeprazole (PRILOSEC) 40 MG capsule Take 40 mg by mouth daily as needed. Take 40 mg by mouth daily as needed.  . rosuvastatin (CRESTOR) 20 MG tablet TAKE 1 TABLET BY MOUTH EVERY DAY     Allergies:   Oxycodone   Social History   Socioeconomic History  . Marital status: Married    Spouse name: Not on file  . Number of children: Not on file  . Years of education: Not on file  . Highest education level: Not on file  Occupational History  .  Not on file  Tobacco Use  . Smoking status: Current Every Day Smoker  . Smokeless tobacco: Never Used  Substance and Sexual Activity  . Alcohol use: No  . Drug use: No  . Sexual activity: Not on file  Other Topics Concern  . Not on file  Social History Narrative  . Not on file   Social Determinants of Health   Financial Resource Strain:   . Difficulty of Paying Living Expenses:   Food Insecurity:   . Worried About Charity fundraiser in the Last Year:   . Arboriculturist in the Last Year:   Transportation Needs:   . Film/video editor (Medical):   Marland Kitchen Lack of Transportation (Non-Medical):     Physical Activity:   . Days of Exercise per Week:   . Minutes of Exercise per Session:   Stress:   . Feeling of Stress :   Social Connections:   . Frequency of Communication with Friends and Family:   . Frequency of Social Gatherings with Friends and Family:   . Attends Religious Services:   . Active Member of Clubs or Organizations:   . Attends Archivist Meetings:   Marland Kitchen Marital Status:      Family History: The patient's family history includes Cancer in his father; Hepatitis C in his father and mother. ROS:   Please see the history of present illness.    All 14 point review of systems negative except as described per history of present illness.  EKGs/Labs/Other Studies Reviewed:    The following studies were reviewed today:   EKG:  EKG is  ordered today.  The ekg ordered today demonstrates normal sinus rhythm, left axis deviation, no ST segment changes.  Recent Labs: No results found for requested labs within last 8760 hours.  Recent Lipid Panel    Component Value Date/Time   CHOL 164 09/26/2016 0931   TRIG (H) 09/26/2016 0931    446.0 Triglyceride is over 400; calculations on Lipids are invalid.   HDL 20.10 (L) 09/26/2016 0931   CHOLHDL 8 09/26/2016 0931   LDLDIRECT 95.0 09/26/2016 0931    Physical Exam:    VS:  BP 122/90   Pulse 94   Ht 5' 11.5" (1.816 m)   Wt 251 lb (113.9 kg)   SpO2 97%   BMI 34.52 kg/m     Wt Readings from Last 3 Encounters:  10/19/19 251 lb (113.9 kg)  02/19/17 228 lb (103.4 kg)  09/25/16 258 lb 2 oz (117.1 kg)     GEN:  Well nourished, well developed in no acute distress HEENT: Normal NECK: No JVD; No carotid bruits LYMPHATICS: No lymphadenopathy CARDIAC: RRR, no murmurs, no rubs, no gallops RESPIRATORY:  Clear to auscultation without rales, wheezing or rhonchi  ABDOMEN: Soft, non-tender, non-distended MUSCULOSKELETAL:  No edema; No deformity  SKIN: Warm and dry NEUROLOGIC:  Alert and oriented x 3 PSYCHIATRIC:   Normal affect   ASSESSMENT:    1. Hypertension, essential, benign   2. Hyperlipemia, mixed   3. Severe obesity (BMI 35.0-39.9) with comorbidity ([place])   4. Tobacco use disorder    PLAN:    In order of problems listed above:  1. Multiple risk factors for coronary artery disease.  He is essential hypertension seems to be well managed.  We'll continue present medications and actually looking at the medication he does not do anything to reduce his blood pressure.  We'll continue monitoring an echocardiogram if blood pressure will  be elevated. 2. Dyslipidemia he is scheduled in September to see his primary care physician to redo his cholesterol will wait for it.  In the meantime he is on high intensity statin he is taking Kristen 20 mg which we'll continue. 3. Obesity we did talk about need to exercise as well as good diet he understand he will try to do that. 4. Smoking absolutely the biggest risk factor for his close clinical scenario I told him that smoking dabbled risk for coronary artery disease he understand he will try to do something about it he is vaping however I told him that vaping is also not good.  We spent a lot of time talking about risk factors modification about the need to exercise on the regular basis.  We talked about need to quit smoking I will ask him to have an echocardiogram done to assess left ventricle ejection fraction I will see him back in my office in about 3 months to see how he is progressing.  I think he will require a lot of coaching and encouragement.   Medication Adjustments/Labs and Tests Ordered: Current medicines are reviewed at length with the patient today.  Concerns regarding medicines are outlined above.  No orders of the defined types were placed in this encounter.  No orders of the defined types were placed in this encounter.   Signed, Park Liter, MD, [facility]. 10/19/2019 4:13 PM    [facility]

## 2019-10-24 ENCOUNTER — Other Ambulatory Visit: Payer: Self-pay

## 2019-10-24 ENCOUNTER — Ambulatory Visit (HOSPITAL_BASED_OUTPATIENT_CLINIC_OR_DEPARTMENT_OTHER)
Admission: RE | Admit: 2019-10-24 | Discharge: 2019-10-24 | Disposition: A | Discharge status: Home / Self Care | Payer: BC Managed Care – PPO | Source: Ambulatory Visit | Attending: Cardiology | Admitting: Cardiology

## 2019-10-24 DIAGNOSIS — E782 Mixed hyperlipidemia: Secondary | ICD-10-CM | POA: Diagnosis not present

## 2019-10-24 DIAGNOSIS — I10 Essential (primary) hypertension: Secondary | ICD-10-CM | POA: Diagnosis not present

## 2019-10-24 NOTE — Progress Notes (Signed)
  Echocardiogram 2D Echocardiogram has been performed.  Basel Defalco A Vonzell Lindblad 10/24/2019, 1:52 PM

## 2019-11-17 DIAGNOSIS — L02412 Cutaneous abscess of left axilla: Secondary | ICD-10-CM | POA: Diagnosis not present

## 2019-11-18 DIAGNOSIS — L02412 Cutaneous abscess of left axilla: Secondary | ICD-10-CM | POA: Diagnosis not present

## 2019-11-25 DIAGNOSIS — L02414 Cutaneous abscess of left upper limb: Secondary | ICD-10-CM | POA: Diagnosis not present

## 2019-11-25 DIAGNOSIS — L0390 Cellulitis, unspecified: Secondary | ICD-10-CM | POA: Diagnosis not present

## 2020-01-25 ENCOUNTER — Ambulatory Visit: Payer: BLUE CROSS/BLUE SHIELD | Admitting: Cardiology

## 2020-01-25 ENCOUNTER — Other Ambulatory Visit: Payer: Self-pay

## 2020-01-25 ENCOUNTER — Encounter: Payer: Self-pay | Admitting: Cardiology

## 2020-01-25 VITALS — BP 136/86 | HR 100 | Ht 72.0 in | Wt 256.0 lb

## 2020-01-25 DIAGNOSIS — G4733 Obstructive sleep apnea (adult) (pediatric): Secondary | ICD-10-CM | POA: Diagnosis not present

## 2020-01-25 DIAGNOSIS — E782 Mixed hyperlipidemia: Secondary | ICD-10-CM

## 2020-01-25 DIAGNOSIS — Z9989 Dependence on other enabling machines and devices: Secondary | ICD-10-CM

## 2020-01-25 DIAGNOSIS — I10 Essential (primary) hypertension: Secondary | ICD-10-CM | POA: Diagnosis not present

## 2020-01-25 NOTE — Patient Instructions (Signed)

## 2020-01-25 NOTE — Progress Notes (Signed)
Cardiology Office Note:    Date:  01/25/2020   ID:  Gerald Barrett, DOB [birth year], MRN [id number]  PCP:  Wenda Low, MD  Cardiologist:  Jenne Campus, MD    Referring MD: Wenda Low, MD   No chief complaint on file. Doing well  History of Present Illness:    Gerald Barrett is a 44 y.o. male   who is being seen today for the evaluation of multiple risk factors for coronary disease at the request of Wenda Low, MD.  I met this gentleman last week when I saw his wife.  He is from [place] and he was interpreting for his wife.  We started talking about multiple risk factors for coronary artery disease and he decided to come to be establish as a patient to talk about those risks.  He denies have any chest pain tightness squeezing pressure burning chest.  He does have some exertional shortness of breath.  But he does have multiple risk factors and does include smoking which is still ongoing.  He also have diabetes which is fairly controlled.  He does have essential hypertension.  He does not exercise on the regular basis.  Therefore, we spent majority of visit talking about risk factors modification that he can take to prevent him to have problem in the future. He does have family history of premature coronary artery disease  Comes today to my office for follow-up.  Overall doing well.  Denies of any chest pain tightness squeezing pressure burning chest.  He is changing his diet he does not eat carbohydrates anymore he lost some weight already and he is very happy with that.  It should also help with his cholesterol.  Past Medical History:  Diagnosis Date  . Abnormal LFTs (liver function tests) 08/27/2015   Hepatitis work-up negative 08/2015.  Marland Kitchen Allergic rhinitis 09/25/2016  . Anemia    Thalasemia  . Cervicalgia 09/25/2016  . Diabetes mellitus type 2, uncontrolled, without complications 6/33/3545  . Elevated transaminase level 09/25/2016  . Fatigue   . GERD (gastroesophageal reflux  disease)   . Hyperlipemia, mixed 09/25/2016  . Hyperlipidemia    dyslipidemia-hyperTG  . Hypertension   . Hypertension, essential, benign 09/25/2016  . OSA (obstructive sleep apnea)    last sleep study 09/2015  . OSA on CPAP 09/17/2015  . Severe obesity (BMI 35.0-39.9) with comorbidity ([place]) 09/25/2016  . Tobacco use disorder     History reviewed. No pertinent surgical history.  Current Medications: Current Meds  Medication Sig  . amLODipine-benazepril (LOTREL) 5-10 MG capsule Take 1 capsule by mouth daily.  . celecoxib (CELEBREX) 200 MG capsule Take 200 mg by mouth daily as needed. Take 200 mg by mouth daily as needed.  Marland Kitchen CIALIS 5 MG tablet TAKE 1 TABLET BY MOUTH EVERY DAY . SEPARATE AT LEAST 4 HOURS FROM ALPHA BLOCKERS  . FLUoxetine (PROZAC) 10 MG capsule Take 10 mg by mouth daily.  . metFORMIN (GLUCOPHAGE) 500 MG tablet TAKE 1 TABLET BY MOUTH WITH BREAKFAST AND TAKE 2 TABLETS BY MOUTH AT SUPPER.  Marland Kitchen omeprazole (PRILOSEC) 40 MG capsule Take 40 mg by mouth daily as needed. Take 40 mg by mouth daily as needed.  . rosuvastatin (CRESTOR) 20 MG tablet TAKE 1 TABLET BY MOUTH EVERY DAY     Allergies:   Oxycodone   Social History   Socioeconomic History  . Marital status: Married    Spouse name: Not on file  . Number of children: Not on file  . Years of  education: Not on file  . Highest education level: Not on file  Occupational History  . Not on file  Tobacco Use  . Smoking status: Current Every Day Smoker  . Smokeless tobacco: Never Used  Vaping Use  . Vaping Use: Every day  Substance and Sexual Activity  . Alcohol use: No  . Drug use: No  . Sexual activity: Not on file  Other Topics Concern  . Not on file  Social History Narrative  . Not on file   Social Determinants of Health   Financial Resource Strain:   . Difficulty of Paying Living Expenses: Not on file  Food Insecurity:   . Worried About Charity fundraiser in the Last Year: Not on file  . Ran Out of Food in  the Last Year: Not on file  Transportation Needs:   . Lack of Transportation (Medical): Not on file  . Lack of Transportation (Non-Medical): Not on file  Physical Activity:   . Days of Exercise per Week: Not on file  . Minutes of Exercise per Session: Not on file  Stress:   . Feeling of Stress : Not on file  Social Connections:   . Frequency of Communication with Friends and Family: Not on file  . Frequency of Social Gatherings with Friends and Family: Not on file  . Attends Religious Services: Not on file  . Active Member of Clubs or Organizations: Not on file  . Attends Archivist Meetings: Not on file  . Marital Status: Not on file     Family History: The patient's family history includes Cancer in his father; Hepatitis C in his father and mother. ROS:   Please see the history of present illness.    All 14 point review of systems negative except as described per history of present illness  EKGs/Labs/Other Studies Reviewed:      Recent Labs: No results found for requested labs within last 8760 hours.  Recent Lipid Panel    Component Value Date/Time   CHOL 164 09/26/2016 0931   TRIG (H) 09/26/2016 0931    446.0 Triglyceride is over 400; calculations on Lipids are invalid.   HDL 20.10 (L) 09/26/2016 0931   CHOLHDL 8 09/26/2016 0931   LDLDIRECT 95.0 09/26/2016 0931    Physical Exam:    VS:  BP 136/86   Pulse 100   Ht 6' (1.829 m)   Wt 256 lb (116.1 kg)   SpO2 97%   BMI 34.72 kg/m     Wt Readings from Last 3 Encounters:  01/25/20 256 lb (116.1 kg)  10/19/19 251 lb (113.9 kg)  02/19/17 228 lb (103.4 kg)     GEN:  Well nourished, well developed in no acute distress HEENT: Normal NECK: No JVD; No carotid bruits LYMPHATICS: No lymphadenopathy CARDIAC: RRR, no murmurs, no rubs, no gallops RESPIRATORY:  Clear to auscultation without rales, wheezing or rhonchi  ABDOMEN: Soft, non-tender, non-distended MUSCULOSKELETAL:  No edema; No deformity  SKIN:  Warm and dry LOWER EXTREMITIES: no swelling NEUROLOGIC:  Alert and oriented x 3 PSYCHIATRIC:  Normal affect   ASSESSMENT:    1. Hypertension, essential, benign   2. Hyperlipemia, mixed   3. OSA on CPAP    PLAN:    In order of problems listed above:  1. Essential hypertension his blood pressure is still mildly elevated I told him that he will probably require some extra medication over for now since he is working on diet and seems to be giving  him some results will wait.  He does have mild degree of left ventricle hypertrophy on echocardiogram. 2. Mixed dyslipidemia: He is scheduled to see his primary care physician within the next few days who will do his fasting lipid profile.  I did review his last cholesterol that I have from year ago which showed very low LDL as well as HDL.  You may be forced to cut down some of his cholesterol medication.  He tells me that his problem was also high triglycerides avoidance of carbohydrates clearly help with that. 3. Smoking: Obviously still a big problem I told him he need to quit but he said that he had very stressful time at his job and that is why he smokes a lot.  Again we discussed this in detail. 4. Obstructive sleep apnea followed by primary care physician   Medication Adjustments/Labs and Tests Ordered: Current medicines are reviewed at length with the patient today.  Concerns regarding medicines are outlined above.  No orders of the defined types were placed in this encounter.  Medication changes: No orders of the defined types were placed in this encounter.   Signed, Park Liter, MD, [facility] 01/25/2020 [place]

## 2020-02-14 ENCOUNTER — Ambulatory Visit
Admission: RE | Admit: 2020-02-14 | Discharge: 2020-02-14 | Disposition: A | Discharge status: Home / Self Care | Payer: BLUE CROSS/BLUE SHIELD | Source: Ambulatory Visit | Attending: Internal Medicine | Admitting: Internal Medicine

## 2020-02-14 ENCOUNTER — Other Ambulatory Visit: Payer: Self-pay | Admitting: Internal Medicine

## 2020-02-14 DIAGNOSIS — Z23 Encounter for immunization: Secondary | ICD-10-CM | POA: Diagnosis not present

## 2020-02-14 DIAGNOSIS — Z0000 Encounter for general adult medical examination without abnormal findings: Secondary | ICD-10-CM | POA: Diagnosis not present

## 2020-02-14 DIAGNOSIS — E1169 Type 2 diabetes mellitus with other specified complication: Secondary | ICD-10-CM | POA: Diagnosis not present

## 2020-02-14 DIAGNOSIS — M542 Cervicalgia: Secondary | ICD-10-CM

## 2020-02-14 DIAGNOSIS — Z125 Encounter for screening for malignant neoplasm of prostate: Secondary | ICD-10-CM | POA: Diagnosis not present

## 2020-02-14 DIAGNOSIS — I10 Essential (primary) hypertension: Secondary | ICD-10-CM | POA: Diagnosis not present

## 2020-02-14 DIAGNOSIS — M47812 Spondylosis without myelopathy or radiculopathy, cervical region: Secondary | ICD-10-CM | POA: Diagnosis not present

## 2020-02-14 DIAGNOSIS — E782 Mixed hyperlipidemia: Secondary | ICD-10-CM | POA: Diagnosis not present

## 2020-02-20 DIAGNOSIS — B974 Respiratory syncytial virus as the cause of diseases classified elsewhere: Secondary | ICD-10-CM | POA: Diagnosis not present

## 2020-02-20 DIAGNOSIS — J101 Influenza due to other identified influenza virus with other respiratory manifestations: Secondary | ICD-10-CM | POA: Diagnosis not present

## 2020-02-20 DIAGNOSIS — U071 COVID-19: Secondary | ICD-10-CM | POA: Diagnosis not present

## 2020-02-20 DIAGNOSIS — Z20828 Contact with and (suspected) exposure to other viral communicable diseases: Secondary | ICD-10-CM | POA: Diagnosis not present

## 2020-05-01 DIAGNOSIS — G4733 Obstructive sleep apnea (adult) (pediatric): Secondary | ICD-10-CM | POA: Diagnosis not present

## 2020-05-22 ENCOUNTER — Telehealth: Payer: Self-pay | Admitting: Cardiology

## 2020-05-22 NOTE — Telephone Encounter (Signed)
Patient requested to speak with Dr. Charm Rings nurse. He states they discussed doing a nuclear stress test and the patient declined originally. Patient states he would like to discuss setting this test up now. Please call/advise.   Thank you!!

## 2020-05-23 NOTE — Telephone Encounter (Signed)
Left message for patient to return call.

## 2020-05-31 DIAGNOSIS — U071 COVID-19: Secondary | ICD-10-CM | POA: Diagnosis not present

## 2020-05-31 DIAGNOSIS — Z20828 Contact with and (suspected) exposure to other viral communicable diseases: Secondary | ICD-10-CM | POA: Diagnosis not present

## 2020-05-31 DIAGNOSIS — B974 Respiratory syncytial virus as the cause of diseases classified elsewhere: Secondary | ICD-10-CM | POA: Diagnosis not present

## 2020-05-31 DIAGNOSIS — J101 Influenza due to other identified influenza virus with other respiratory manifestations: Secondary | ICD-10-CM | POA: Diagnosis not present

## 2020-06-04 NOTE — Telephone Encounter (Signed)
Called and spoke to patient. His aunt is in the medical field and she mentioned him maybe getting a stress test done. He wanted to know if Dr. Bing Matter recommends that for him. [place] is [place]. He will return in February. He asked me to cancel January appointment. Rescheduled him for soonest appointment in [place]. He reports if a stress test is recommended we can set up at next appointment.

## 2020-06-04 NOTE — Telephone Encounter (Signed)
There is a little bit longer discussion I will talk to him when I see him next time in the office.

## 2020-06-13 ENCOUNTER — Ambulatory Visit: Payer: BLUE CROSS/BLUE SHIELD | Admitting: Cardiology

## 2020-07-20 ENCOUNTER — Other Ambulatory Visit: Payer: Self-pay

## 2020-07-20 DIAGNOSIS — D649 Anemia, unspecified: Secondary | ICD-10-CM | POA: Insufficient documentation

## 2020-07-20 DIAGNOSIS — I10 Essential (primary) hypertension: Secondary | ICD-10-CM | POA: Insufficient documentation

## 2020-07-20 DIAGNOSIS — E785 Hyperlipidemia, unspecified: Secondary | ICD-10-CM | POA: Insufficient documentation

## 2020-07-20 DIAGNOSIS — G4733 Obstructive sleep apnea (adult) (pediatric): Secondary | ICD-10-CM | POA: Insufficient documentation

## 2020-07-20 DIAGNOSIS — R5383 Other fatigue: Secondary | ICD-10-CM | POA: Insufficient documentation

## 2020-07-31 ENCOUNTER — Ambulatory Visit: Payer: BLUE CROSS/BLUE SHIELD | Admitting: Cardiology

## 2020-07-31 ENCOUNTER — Encounter: Payer: Self-pay | Admitting: Cardiology

## 2020-07-31 ENCOUNTER — Other Ambulatory Visit: Payer: Self-pay

## 2020-07-31 VITALS — BP 130/88 | HR 97 | Ht 73.5 in | Wt 245.0 lb

## 2020-07-31 DIAGNOSIS — F17200 Nicotine dependence, unspecified, uncomplicated: Secondary | ICD-10-CM

## 2020-07-31 DIAGNOSIS — E782 Mixed hyperlipidemia: Secondary | ICD-10-CM

## 2020-07-31 DIAGNOSIS — I10 Essential (primary) hypertension: Secondary | ICD-10-CM | POA: Diagnosis not present

## 2020-07-31 NOTE — Patient Instructions (Signed)

## 2020-07-31 NOTE — Progress Notes (Signed)
Cardiology Office Note:    Date:  07/31/2020   ID:  Gerald Barrett, DOB 05/12/1976, MRN [id number]  PCP:  Georgann Housekeeper, MD  Cardiologist:  Gypsy Balsam, MD    Referring MD: Georgann Housekeeper, MD   Chief Complaint  Patient presents with  . Follow-up    History of Present Illness:    Gerald Barrett is a 45 y.o. male with past medical history significant for multiple risk factors for coronary artery disease that includes essential hypertension, smoking, dyslipidemia.  He comes today to my office to discuss his issue he apparently recently was in [place] did talk to his aunt who is a geriatrician and she told him that he required stress test.  He is completely asymptomatic.  Denies having chest pain tightness squeezing pressure burning chest.  He still have some risk factors that are not modified especially smoking.  Past Medical History:  Diagnosis Date  . Abnormal LFTs (liver function tests) 08/27/2015   Hepatitis work-up negative 08/2015.  Marland Kitchen Allergic rhinitis 09/25/2016  . Anemia    Thalasemia  . Cervicalgia 09/25/2016  . Diabetes mellitus type 2, uncontrolled, without complications 02/24/2017  . Elevated transaminase level 09/25/2016  . Fatigue   . GERD (gastroesophageal reflux disease)   . Hyperlipemia, mixed 09/25/2016  . Hyperlipidemia    dyslipidemia-hyperTG  . Hypertension   . Hypertension, essential, benign 09/25/2016  . OSA (obstructive sleep apnea)    last sleep study 09/2015  . OSA on CPAP 09/17/2015  . Severe obesity (BMI 35.0-39.9) with comorbidity (HCC) 09/25/2016  . Tobacco use disorder     Past Surgical History:  Procedure Laterality Date  . none      Current Medications: Current Meds  Medication Sig  . amLODipine-benazepril (LOTREL) 5-10 MG capsule Take 1 capsule by mouth daily.  . celecoxib (CELEBREX) 200 MG capsule Take 200 mg by mouth daily as needed for moderate pain. Take 200 mg by mouth daily as needed.  Marland Kitchen FLUoxetine (PROZAC) 10 MG capsule Take 10 mg  by mouth daily.  . metFORMIN (GLUCOPHAGE) 500 MG tablet TAKE 1 TABLET BY MOUTH WITH BREAKFAST AND TAKE 2 TABLETS BY MOUTH AT SUPPER.  Marland Kitchen omeprazole (PRILOSEC) 40 MG capsule Take 40 mg by mouth daily as needed. Take 40 mg by mouth daily as needed.  . pregabalin (LYRICA) 75 MG capsule Take 75 mg by mouth daily.  . rosuvastatin (CRESTOR) 20 MG tablet TAKE 1 TABLET BY MOUTH EVERY DAY  . [DISCONTINUED] CIALIS 5 MG tablet TAKE 1 TABLET BY MOUTH EVERY DAY . SEPARATE AT LEAST 4 HOURS FROM ALPHA BLOCKERS  . [DISCONTINUED] gabapentin (NEURONTIN) 300 MG capsule Take 300 mg by mouth daily.     Allergies:   Oxycodone   Social History   Socioeconomic History  . Marital status: Married    Spouse name: Not on file  . Number of children: Not on file  . Years of education: Not on file  . Highest education level: Not on file  Occupational History  . Not on file  Tobacco Use  . Smoking status: Current Every Day Smoker  . Smokeless tobacco: Never Used  Vaping Use  . Vaping Use: Every day  Substance and Sexual Activity  . Alcohol use: No  . Drug use: No  . Sexual activity: Not on file  Other Topics Concern  . Not on file  Social History Narrative  . Not on file   Social Determinants of Health   Financial Resource Strain: Not on file  Food  Insecurity: Not on file  Transportation Needs: Not on file  Physical Activity: Not on file  Stress: Not on file  Social Connections: Not on file     Family History: The patient's family history includes Cancer in his father; Hepatitis C in his father and mother. ROS:   Please see the history of present illness.    All 14 point review of systems negative except as described per history of present illness  EKGs/Labs/Other Studies Reviewed:      Recent Labs: No results found for requested labs within last 8760 hours.  Recent Lipid Panel    Component Value Date/Time   CHOL 164 09/26/2016 0931   TRIG (H) 09/26/2016 0931    446.0 Triglyceride is  over 400; calculations on Lipids are invalid.   HDL 20.10 (L) 09/26/2016 0931   CHOLHDL 8 09/26/2016 0931   LDLDIRECT 95.0 09/26/2016 0931    Physical Exam:    VS:  BP 130/88 (BP Location: Right Arm, Patient Position: Sitting)   Pulse 97   Ht 6' 1.5" (1.867 m)   Wt 245 lb (111.1 kg)   SpO2 95%   BMI 31.89 kg/m     Wt Readings from Last 3 Encounters:  07/31/20 245 lb (111.1 kg)  01/25/20 256 lb (116.1 kg)  10/19/19 251 lb (113.9 kg)     GEN:  Well nourished, well developed in no acute distress HEENT: Normal NECK: No JVD; No carotid bruits LYMPHATICS: No lymphadenopathy CARDIAC: RRR, no murmurs, no rubs, no gallops RESPIRATORY:  Clear to auscultation without rales, wheezing or rhonchi  ABDOMEN: Soft, non-tender, non-distended MUSCULOSKELETAL:  No edema; No deformity  SKIN: Warm and dry LOWER EXTREMITIES: no swelling NEUROLOGIC:  Alert and oriented x 3 PSYCHIATRIC:  Normal affect   ASSESSMENT:    1. Hypertension, essential, benign   2. Tobacco use disorder   3. Mixed hyperlipidemia    PLAN:    In order of problems listed above:  1. Hypertension seems to be controlled on appropriate medications which I will continue. 2. Smoking obviously big problem I had a long discussion with him and strongly recommend to quit and I told him quitting smoking No chance of any heart problem by 50%. 3. Mixed dyslipidemia: He is taking Crestor however dose recently has been reduced since his LDL dropped to 23.  Will wait for results of his fasting lipid profile which will be done with primary care physician within next few weeks. 4. He asked me question if he should be on aspirin.  With such a low LDL we cannot calculate his 10 years predicted risk for coronary artery events.  I told him if his water about coronary events it should be appropriate to take aspirin with understanding that he will have no complication with meaning if there is some risk of bleeding or if he believes that aspirin  need to be withdrawn. 5. We did talk in length about risk factors modifications I strongly encouraged him to exercise on the regular basis and watching his diet and obviously we spent a great of time talking about need to quit smoking.   Medication Adjustments/Labs and Tests Ordered: Current medicines are reviewed at length with the patient today.  Concerns regarding medicines are outlined above.  No orders of the defined types were placed in this encounter.  Medication changes: No orders of the defined types were placed in this encounter.   Signed, Georgeanna Lea, MD, [facility] 07/31/2020 3:41 PM    [facility] 

## 2020-08-13 DIAGNOSIS — E782 Mixed hyperlipidemia: Secondary | ICD-10-CM | POA: Diagnosis not present

## 2020-08-13 DIAGNOSIS — E1169 Type 2 diabetes mellitus with other specified complication: Secondary | ICD-10-CM | POA: Diagnosis not present

## 2020-08-13 DIAGNOSIS — I10 Essential (primary) hypertension: Secondary | ICD-10-CM | POA: Diagnosis not present

## 2020-08-13 DIAGNOSIS — F17210 Nicotine dependence, cigarettes, uncomplicated: Secondary | ICD-10-CM | POA: Diagnosis not present

## 2020-08-25 DIAGNOSIS — L918 Other hypertrophic disorders of the skin: Secondary | ICD-10-CM | POA: Diagnosis not present

## 2020-08-25 DIAGNOSIS — M85662 Other cyst of bone, left lower leg: Secondary | ICD-10-CM | POA: Diagnosis not present

## 2020-08-25 DIAGNOSIS — L0390 Cellulitis, unspecified: Secondary | ICD-10-CM | POA: Diagnosis not present

## 2020-08-25 DIAGNOSIS — R21 Rash and other nonspecific skin eruption: Secondary | ICD-10-CM | POA: Diagnosis not present

## 2020-08-26 DIAGNOSIS — S81801A Unspecified open wound, right lower leg, initial encounter: Secondary | ICD-10-CM | POA: Diagnosis not present

## 2020-08-26 DIAGNOSIS — R21 Rash and other nonspecific skin eruption: Secondary | ICD-10-CM | POA: Diagnosis not present

## 2020-08-26 DIAGNOSIS — L0390 Cellulitis, unspecified: Secondary | ICD-10-CM | POA: Diagnosis not present

## 2020-08-28 DIAGNOSIS — R21 Rash and other nonspecific skin eruption: Secondary | ICD-10-CM | POA: Diagnosis not present

## 2020-08-28 DIAGNOSIS — L0390 Cellulitis, unspecified: Secondary | ICD-10-CM | POA: Diagnosis not present

## 2020-09-09 DIAGNOSIS — E785 Hyperlipidemia, unspecified: Secondary | ICD-10-CM | POA: Diagnosis not present

## 2020-09-09 DIAGNOSIS — M5459 Other low back pain: Secondary | ICD-10-CM | POA: Diagnosis not present

## 2020-09-09 DIAGNOSIS — L0390 Cellulitis, unspecified: Secondary | ICD-10-CM | POA: Diagnosis not present

## 2020-09-09 DIAGNOSIS — E119 Type 2 diabetes mellitus without complications: Secondary | ICD-10-CM | POA: Diagnosis not present

## 2020-11-19 DIAGNOSIS — E1169 Type 2 diabetes mellitus with other specified complication: Secondary | ICD-10-CM | POA: Diagnosis not present

## 2020-11-19 DIAGNOSIS — I10 Essential (primary) hypertension: Secondary | ICD-10-CM | POA: Diagnosis not present

## 2020-11-19 DIAGNOSIS — G43909 Migraine, unspecified, not intractable, without status migrainosus: Secondary | ICD-10-CM | POA: Diagnosis not present

## 2020-11-19 DIAGNOSIS — E782 Mixed hyperlipidemia: Secondary | ICD-10-CM | POA: Diagnosis not present

## 2020-11-20 DIAGNOSIS — E782 Mixed hyperlipidemia: Secondary | ICD-10-CM | POA: Diagnosis not present

## 2020-11-20 DIAGNOSIS — I10 Essential (primary) hypertension: Secondary | ICD-10-CM | POA: Diagnosis not present

## 2020-11-20 DIAGNOSIS — Z23 Encounter for immunization: Secondary | ICD-10-CM | POA: Diagnosis not present

## 2020-11-20 DIAGNOSIS — E1169 Type 2 diabetes mellitus with other specified complication: Secondary | ICD-10-CM | POA: Diagnosis not present

## 2020-12-05 DIAGNOSIS — R3121 Asymptomatic microscopic hematuria: Secondary | ICD-10-CM | POA: Diagnosis not present

## 2020-12-05 DIAGNOSIS — R809 Proteinuria, unspecified: Secondary | ICD-10-CM | POA: Diagnosis not present

## 2020-12-19 ENCOUNTER — Other Ambulatory Visit: Payer: Self-pay | Admitting: Internal Medicine

## 2020-12-19 DIAGNOSIS — I10 Essential (primary) hypertension: Secondary | ICD-10-CM

## 2020-12-20 ENCOUNTER — Other Ambulatory Visit: Payer: Self-pay | Admitting: Internal Medicine

## 2020-12-20 DIAGNOSIS — E1365 Other specified diabetes mellitus with hyperglycemia: Secondary | ICD-10-CM

## 2020-12-20 DIAGNOSIS — I10 Essential (primary) hypertension: Secondary | ICD-10-CM

## 2021-01-02 DIAGNOSIS — I129 Hypertensive chronic kidney disease with stage 1 through stage 4 chronic kidney disease, or unspecified chronic kidney disease: Secondary | ICD-10-CM | POA: Diagnosis not present

## 2021-01-02 DIAGNOSIS — R809 Proteinuria, unspecified: Secondary | ICD-10-CM | POA: Diagnosis not present

## 2021-01-02 DIAGNOSIS — G43909 Migraine, unspecified, not intractable, without status migrainosus: Secondary | ICD-10-CM | POA: Diagnosis not present

## 2021-01-02 DIAGNOSIS — E1122 Type 2 diabetes mellitus with diabetic chronic kidney disease: Secondary | ICD-10-CM | POA: Diagnosis not present

## 2021-01-04 ENCOUNTER — Other Ambulatory Visit: Payer: Self-pay | Admitting: Nephrology

## 2021-01-04 DIAGNOSIS — R079 Chest pain, unspecified: Secondary | ICD-10-CM | POA: Diagnosis not present

## 2021-01-04 DIAGNOSIS — R809 Proteinuria, unspecified: Secondary | ICD-10-CM

## 2021-01-04 DIAGNOSIS — Z136 Encounter for screening for cardiovascular disorders: Secondary | ICD-10-CM | POA: Diagnosis not present

## 2021-01-07 ENCOUNTER — Inpatient Hospital Stay: Admission: RE | Admit: 2021-01-07 | Payer: BC Managed Care – PPO | Source: Ambulatory Visit

## 2021-01-08 ENCOUNTER — Ambulatory Visit: Payer: BC Managed Care – PPO | Admitting: Cardiology

## 2021-01-09 ENCOUNTER — Encounter: Payer: Self-pay | Admitting: Cardiology

## 2021-01-10 ENCOUNTER — Ambulatory Visit
Admission: RE | Admit: 2021-01-10 | Discharge: 2021-01-10 | Disposition: A | Discharge status: Home / Self Care | Payer: BC Managed Care – PPO | Source: Ambulatory Visit | Attending: Nephrology | Admitting: Nephrology

## 2021-01-10 DIAGNOSIS — R809 Proteinuria, unspecified: Secondary | ICD-10-CM | POA: Diagnosis not present

## 2021-01-28 DIAGNOSIS — R9431 Abnormal electrocardiogram [ECG] [EKG]: Secondary | ICD-10-CM | POA: Diagnosis not present

## 2021-01-28 DIAGNOSIS — R0602 Shortness of breath: Secondary | ICD-10-CM | POA: Diagnosis not present

## 2021-01-28 DIAGNOSIS — E785 Hyperlipidemia, unspecified: Secondary | ICD-10-CM | POA: Insufficient documentation

## 2021-01-28 DIAGNOSIS — I10 Essential (primary) hypertension: Secondary | ICD-10-CM | POA: Diagnosis not present

## 2021-02-28 DIAGNOSIS — I10 Essential (primary) hypertension: Secondary | ICD-10-CM | POA: Diagnosis not present

## 2021-02-28 DIAGNOSIS — E1169 Type 2 diabetes mellitus with other specified complication: Secondary | ICD-10-CM | POA: Diagnosis not present

## 2021-02-28 DIAGNOSIS — E782 Mixed hyperlipidemia: Secondary | ICD-10-CM | POA: Diagnosis not present

## 2021-03-05 DIAGNOSIS — R809 Proteinuria, unspecified: Secondary | ICD-10-CM | POA: Diagnosis not present

## 2021-03-08 DIAGNOSIS — G4733 Obstructive sleep apnea (adult) (pediatric): Secondary | ICD-10-CM | POA: Diagnosis not present

## 2021-03-11 DIAGNOSIS — I129 Hypertensive chronic kidney disease with stage 1 through stage 4 chronic kidney disease, or unspecified chronic kidney disease: Secondary | ICD-10-CM | POA: Diagnosis not present

## 2021-03-11 DIAGNOSIS — G43909 Migraine, unspecified, not intractable, without status migrainosus: Secondary | ICD-10-CM | POA: Diagnosis not present

## 2021-03-11 DIAGNOSIS — R809 Proteinuria, unspecified: Secondary | ICD-10-CM | POA: Diagnosis not present

## 2021-03-11 DIAGNOSIS — E1122 Type 2 diabetes mellitus with diabetic chronic kidney disease: Secondary | ICD-10-CM | POA: Diagnosis not present

## 2021-04-08 DIAGNOSIS — G4733 Obstructive sleep apnea (adult) (pediatric): Secondary | ICD-10-CM | POA: Diagnosis not present

## 2021-05-08 DIAGNOSIS — G4733 Obstructive sleep apnea (adult) (pediatric): Secondary | ICD-10-CM | POA: Diagnosis not present

## 2021-05-20 DIAGNOSIS — G4721 Circadian rhythm sleep disorder, delayed sleep phase type: Secondary | ICD-10-CM | POA: Diagnosis not present

## 2021-05-20 DIAGNOSIS — G4733 Obstructive sleep apnea (adult) (pediatric): Secondary | ICD-10-CM | POA: Diagnosis not present

## 2021-06-08 DIAGNOSIS — G4733 Obstructive sleep apnea (adult) (pediatric): Secondary | ICD-10-CM | POA: Diagnosis not present

## 2021-08-05 DIAGNOSIS — R809 Proteinuria, unspecified: Secondary | ICD-10-CM | POA: Diagnosis not present

## 2021-08-13 DIAGNOSIS — I129 Hypertensive chronic kidney disease with stage 1 through stage 4 chronic kidney disease, or unspecified chronic kidney disease: Secondary | ICD-10-CM | POA: Diagnosis not present

## 2021-08-13 DIAGNOSIS — R809 Proteinuria, unspecified: Secondary | ICD-10-CM | POA: Diagnosis not present

## 2021-08-13 DIAGNOSIS — E1122 Type 2 diabetes mellitus with diabetic chronic kidney disease: Secondary | ICD-10-CM | POA: Diagnosis not present

## 2021-08-13 DIAGNOSIS — G43909 Migraine, unspecified, not intractable, without status migrainosus: Secondary | ICD-10-CM | POA: Diagnosis not present

## 2021-11-06 DIAGNOSIS — E1169 Type 2 diabetes mellitus with other specified complication: Secondary | ICD-10-CM | POA: Diagnosis not present

## 2021-11-06 DIAGNOSIS — R809 Proteinuria, unspecified: Secondary | ICD-10-CM | POA: Diagnosis not present

## 2021-11-06 DIAGNOSIS — I10 Essential (primary) hypertension: Secondary | ICD-10-CM | POA: Diagnosis not present

## 2021-11-06 DIAGNOSIS — E1129 Type 2 diabetes mellitus with other diabetic kidney complication: Secondary | ICD-10-CM | POA: Diagnosis not present

## 2021-11-06 DIAGNOSIS — Z125 Encounter for screening for malignant neoplasm of prostate: Secondary | ICD-10-CM | POA: Diagnosis not present

## 2021-11-06 DIAGNOSIS — E782 Mixed hyperlipidemia: Secondary | ICD-10-CM | POA: Diagnosis not present

## 2021-12-17 DIAGNOSIS — H2513 Age-related nuclear cataract, bilateral: Secondary | ICD-10-CM | POA: Diagnosis not present

## 2021-12-17 DIAGNOSIS — E119 Type 2 diabetes mellitus without complications: Secondary | ICD-10-CM | POA: Diagnosis not present

## 2022-02-11 DIAGNOSIS — M25562 Pain in left knee: Secondary | ICD-10-CM | POA: Diagnosis not present

## 2022-02-11 DIAGNOSIS — E782 Mixed hyperlipidemia: Secondary | ICD-10-CM | POA: Diagnosis not present

## 2022-02-11 DIAGNOSIS — M25561 Pain in right knee: Secondary | ICD-10-CM | POA: Diagnosis not present

## 2022-02-11 DIAGNOSIS — E1169 Type 2 diabetes mellitus with other specified complication: Secondary | ICD-10-CM | POA: Diagnosis not present

## 2022-03-13 DIAGNOSIS — M1711 Unilateral primary osteoarthritis, right knee: Secondary | ICD-10-CM | POA: Diagnosis not present

## 2022-03-13 DIAGNOSIS — M170 Bilateral primary osteoarthritis of knee: Secondary | ICD-10-CM | POA: Diagnosis not present

## 2022-03-13 DIAGNOSIS — M1712 Unilateral primary osteoarthritis, left knee: Secondary | ICD-10-CM | POA: Diagnosis not present

## 2022-04-03 DIAGNOSIS — E1122 Type 2 diabetes mellitus with diabetic chronic kidney disease: Secondary | ICD-10-CM | POA: Diagnosis not present

## 2022-04-03 DIAGNOSIS — R809 Proteinuria, unspecified: Secondary | ICD-10-CM | POA: Diagnosis not present

## 2022-04-03 DIAGNOSIS — I129 Hypertensive chronic kidney disease with stage 1 through stage 4 chronic kidney disease, or unspecified chronic kidney disease: Secondary | ICD-10-CM | POA: Diagnosis not present

## 2022-04-03 DIAGNOSIS — N182 Chronic kidney disease, stage 2 (mild): Secondary | ICD-10-CM | POA: Diagnosis not present

## 2022-06-24 DIAGNOSIS — Z79899 Other long term (current) drug therapy: Secondary | ICD-10-CM | POA: Diagnosis not present

## 2022-06-24 DIAGNOSIS — Z23 Encounter for immunization: Secondary | ICD-10-CM | POA: Diagnosis not present

## 2022-06-24 DIAGNOSIS — E785 Hyperlipidemia, unspecified: Secondary | ICD-10-CM | POA: Diagnosis not present

## 2022-06-24 DIAGNOSIS — I10 Essential (primary) hypertension: Secondary | ICD-10-CM | POA: Diagnosis not present

## 2022-06-24 DIAGNOSIS — E119 Type 2 diabetes mellitus without complications: Secondary | ICD-10-CM | POA: Diagnosis not present

## 2022-06-30 DIAGNOSIS — D509 Iron deficiency anemia, unspecified: Secondary | ICD-10-CM | POA: Diagnosis not present

## 2022-06-30 DIAGNOSIS — K219 Gastro-esophageal reflux disease without esophagitis: Secondary | ICD-10-CM | POA: Diagnosis not present

## 2022-06-30 DIAGNOSIS — Z125 Encounter for screening for malignant neoplasm of prostate: Secondary | ICD-10-CM | POA: Diagnosis not present

## 2022-06-30 DIAGNOSIS — E785 Hyperlipidemia, unspecified: Secondary | ICD-10-CM | POA: Diagnosis not present

## 2022-06-30 DIAGNOSIS — E119 Type 2 diabetes mellitus without complications: Secondary | ICD-10-CM | POA: Diagnosis not present

## 2022-06-30 DIAGNOSIS — I10 Essential (primary) hypertension: Secondary | ICD-10-CM | POA: Diagnosis not present

## 2022-07-08 DIAGNOSIS — Z1211 Encounter for screening for malignant neoplasm of colon: Secondary | ICD-10-CM | POA: Diagnosis not present

## 2022-07-08 DIAGNOSIS — I10 Essential (primary) hypertension: Secondary | ICD-10-CM | POA: Diagnosis not present

## 2022-07-08 DIAGNOSIS — K219 Gastro-esophageal reflux disease without esophagitis: Secondary | ICD-10-CM | POA: Diagnosis not present

## 2022-07-08 DIAGNOSIS — K5900 Constipation, unspecified: Secondary | ICD-10-CM | POA: Diagnosis not present

## 2022-07-09 ENCOUNTER — Ambulatory Visit: Payer: BC Managed Care – PPO | Attending: Cardiology | Admitting: Cardiology

## 2022-07-09 ENCOUNTER — Encounter: Payer: Self-pay | Admitting: Cardiology

## 2022-07-09 VITALS — BP 124/76 | HR 80 | Ht 72.0 in | Wt 226.0 lb

## 2022-07-09 DIAGNOSIS — I10 Essential (primary) hypertension: Secondary | ICD-10-CM

## 2022-07-09 DIAGNOSIS — E785 Hyperlipidemia, unspecified: Secondary | ICD-10-CM | POA: Diagnosis not present

## 2022-07-09 DIAGNOSIS — F17200 Nicotine dependence, unspecified, uncomplicated: Secondary | ICD-10-CM

## 2022-07-09 DIAGNOSIS — G4733 Obstructive sleep apnea (adult) (pediatric): Secondary | ICD-10-CM

## 2022-07-09 NOTE — Patient Instructions (Addendum)
Medication Instructions:  Your physician recommends that you continue on your current medications as directed. Please refer to the Current Medication list given to you today.  *If you need a refill on your cardiac medications before your next appointment, please call your pharmacy*   Lab Work: None Ordered If you have labs (blood work) drawn today and your tests are completely normal, you will receive your results only by: [place] (if you have MyChart) OR A paper copy in the mail If you have any lab test that is abnormal or we need to change your treatment, we will call you to review the results.   Follow-Up: At [facility], you and your health needs are our priority.  As part of our continuing mission to provide you with exceptional heart care, we have created designated Provider Care Teams.  These Care Teams include your primary Cardiologist (physician) and Advanced Practice Providers (APPs -  Physician Assistants and Nurse Practitioners) who all work together to provide you with the care you need, when you need it.  We recommend signing up for the patient portal called "MyChart".  Sign up information is provided on this After Visit Summary.  MyChart is used to connect with patients for Virtual Visits (Telemedicine).  Patients are able to view lab/test results, encounter notes, upcoming appointments, etc.  Non-urgent messages can be sent to your provider as well.   To learn more about what you can do with MyChart, go to [email or web address].    Your next appointment:   12 month(s)  The format for your next appointment:   In Person  Provider:   Jenne Campus, MD    Other Instructions NA   This visit was accompanied by Enrigue Catena.

## 2022-07-09 NOTE — Progress Notes (Signed)
Cardiology Office Note:    Date:  07/09/2022   ID:  Gerald Barrett, DOB May 30, 1976, MRN [id number]  PCP:  Wenda Low, MD  Cardiologist:  Jenne Campus, MD    Referring MD: Wenda Low, MD   Chief Complaint  Patient presents with   Follow-up    History of Present Illness:    Gerald Barrett is a 47 y.o. male with past medical history significant for multiple risk factors for coronary artery disease that include essential hypertension, smoking, dyslipidemia.  He comes today to months for follow-up.  Overall he is doing well he denies have any chest pain tightness squeezing pressure burning chest.  He did have a calcium score done we were able to retrieve calcium score is 0 which is very encouraging.  Sadly he still continues to smoking still complain of being under a lot of stress.  Past Medical History:  Diagnosis Date   Abnormal LFTs (liver function tests) 08/27/2015   Hepatitis work-up negative 08/2015.   Allergic rhinitis 09/25/2016   Anemia    Thalasemia   Cervicalgia 09/25/2016   Diabetes mellitus type 2, uncontrolled, without complications [id number]   Elevated transaminase level 09/25/2016   Fatigue    GERD (gastroesophageal reflux disease)    Hyperlipemia, mixed 09/25/2016   Hyperlipidemia    dyslipidemia-hyperTG   Hypertension    Hypertension, essential, benign 09/25/2016   OSA (obstructive sleep apnea)    last sleep study 09/2015   OSA on CPAP 09/17/2015   Severe obesity (BMI 35.0-39.9) with comorbidity ([facility]) 09/25/2016   Tobacco use disorder     Past Surgical History:  Procedure Laterality Date   none      Current Medications: Current Meds  Medication Sig   amLODipine-benazepril (LOTREL) 5-10 MG capsule Take 1 capsule by mouth daily.   celecoxib (CELEBREX) 200 MG capsule Take 200 mg by mouth daily as needed for moderate pain. Take 200 mg by mouth daily as needed.   FLUoxetine (PROZAC) 10 MG capsule Take 10 mg by mouth daily.   metFORMIN (GLUCOPHAGE) 500 MG  tablet TAKE 1 TABLET BY MOUTH WITH BREAKFAST AND TAKE 2 TABLETS BY MOUTH AT SUPPER.   omeprazole (PRILOSEC) 40 MG capsule Take 40 mg by mouth daily as needed. Take 40 mg by mouth daily as needed.   pregabalin (LYRICA) 75 MG capsule Take 75 mg by mouth daily.   rosuvastatin (CRESTOR) 20 MG tablet TAKE 1 TABLET BY MOUTH EVERY DAY     Allergies:   Oxycodone   Social History   Socioeconomic History   Marital status: Married    Spouse name: Not on file   Number of children: Not on file   Years of education: Not on file   Highest education level: Not on file  Occupational History   Not on file  Tobacco Use   Smoking status: Every Day   Smokeless tobacco: Never  Vaping Use   Vaping Use: Every day  Substance and Sexual Activity   Alcohol use: No   Drug use: No   Sexual activity: Not on file  Other Topics Concern   Not on file  Social History Narrative   Not on file   Social Determinants of Health   Financial Resource Strain: Not on file  Food Insecurity: Not on file  Transportation Needs: Not on file  Physical Activity: Not on file  Stress: Not on file  Social Connections: Not on file     Family History: The patient's family history includes Cancer in his  father; Hepatitis C in his father and mother. ROS:   Please see the history of present illness.    All 14 point review of systems negative except as described per history of present illness  EKGs/Labs/Other Studies Reviewed:      Recent Labs: No results found for requested labs within last 365 days.  Recent Lipid Panel    Component Value Date/Time   CHOL 164 09/26/2016 0931   TRIG (H) 09/26/2016 0931    446.0 Triglyceride is over 400; calculations on Lipids are invalid.   HDL 20.10 (L) 09/26/2016 0931   CHOLHDL 8 09/26/2016 0931   LDLDIRECT 95.0 09/26/2016 0931    Physical Exam:    VS:  BP 124/76 (BP Location: Left Arm, Patient Position: Sitting)   Pulse 80   Ht 6' (1.829 m)   Wt 226 lb (102.5 kg)   BMI  30.65 kg/m     Wt Readings from Last 3 Encounters:  07/09/22 226 lb (102.5 kg)  07/31/20 245 lb (111.1 kg)  01/25/20 256 lb (116.1 kg)     GEN:  Well nourished, well developed in no acute distress HEENT: Normal NECK: No JVD; No carotid bruits LYMPHATICS: No lymphadenopathy CARDIAC: RRR, no murmurs, no rubs, no gallops RESPIRATORY:  Clear to auscultation without rales, wheezing or rhonchi  ABDOMEN: Soft, non-tender, non-distended MUSCULOSKELETAL:  No edema; No deformity  SKIN: Warm and dry LOWER EXTREMITIES: no swelling NEUROLOGIC:  Alert and oriented x 3 PSYCHIATRIC:  Normal affect   ASSESSMENT:    1. Hypertension, essential, benign   2. Dyslipidemia   3. Tobacco use disorder   4. OSA (obstructive sleep apnea)    PLAN:    In order of problems listed above:  Essential hypertension.  Blood pressure seems to be well-controlled continue present management. Dyslipidemia I did review his K PN which show me his LDL 69 HDL 31 this is from June 2023.  Will continue present management. Tobacco use disorder he tells me that he still smokes very little I am afraid he probably smokes I will be more he understand him to quit. Obstructive sleep apnea to be followed by primary care physician  Enrigue Catena chaperone during entire visit Medication Adjustments/Labs and Tests Ordered: Current medicines are reviewed at length with the patient today.  Concerns regarding medicines are outlined above.  Orders Placed This Encounter  Procedures   EKG 12-Lead   Medication changes: No orders of the defined types were placed in this encounter.   Signed, Park Liter, MD, [facility] 07/09/2022 4:06 PM    [place]

## 2022-07-10 DIAGNOSIS — R350 Frequency of micturition: Secondary | ICD-10-CM | POA: Diagnosis not present

## 2022-07-10 DIAGNOSIS — I10 Essential (primary) hypertension: Secondary | ICD-10-CM | POA: Diagnosis not present

## 2022-07-10 DIAGNOSIS — E785 Hyperlipidemia, unspecified: Secondary | ICD-10-CM | POA: Diagnosis not present

## 2022-07-10 DIAGNOSIS — E119 Type 2 diabetes mellitus without complications: Secondary | ICD-10-CM | POA: Diagnosis not present

## 2022-07-11 DIAGNOSIS — G4733 Obstructive sleep apnea (adult) (pediatric): Secondary | ICD-10-CM | POA: Diagnosis not present

## 2022-07-31 ENCOUNTER — Other Ambulatory Visit: Payer: Self-pay | Admitting: Internal Medicine

## 2022-08-05 DIAGNOSIS — Z8042 Family history of malignant neoplasm of prostate: Secondary | ICD-10-CM | POA: Diagnosis not present

## 2022-08-05 DIAGNOSIS — N401 Enlarged prostate with lower urinary tract symptoms: Secondary | ICD-10-CM | POA: Diagnosis not present

## 2022-08-05 DIAGNOSIS — N138 Other obstructive and reflux uropathy: Secondary | ICD-10-CM | POA: Diagnosis not present

## 2022-08-06 DIAGNOSIS — N3289 Other specified disorders of bladder: Secondary | ICD-10-CM | POA: Diagnosis not present

## 2022-08-06 DIAGNOSIS — R369 Urethral discharge, unspecified: Secondary | ICD-10-CM | POA: Diagnosis not present

## 2022-08-06 DIAGNOSIS — N401 Enlarged prostate with lower urinary tract symptoms: Secondary | ICD-10-CM | POA: Diagnosis not present

## 2022-08-09 ENCOUNTER — Other Ambulatory Visit: Payer: Self-pay | Admitting: Internal Medicine

## 2022-08-09 DIAGNOSIS — G4733 Obstructive sleep apnea (adult) (pediatric): Secondary | ICD-10-CM | POA: Diagnosis not present

## 2022-09-09 DIAGNOSIS — G4733 Obstructive sleep apnea (adult) (pediatric): Secondary | ICD-10-CM | POA: Diagnosis not present

## 2022-10-08 DIAGNOSIS — D120 Benign neoplasm of cecum: Secondary | ICD-10-CM | POA: Diagnosis not present

## 2022-10-08 DIAGNOSIS — K635 Polyp of colon: Secondary | ICD-10-CM | POA: Diagnosis not present

## 2022-10-08 DIAGNOSIS — K621 Rectal polyp: Secondary | ICD-10-CM | POA: Diagnosis not present

## 2022-10-08 DIAGNOSIS — Z1211 Encounter for screening for malignant neoplasm of colon: Secondary | ICD-10-CM | POA: Diagnosis not present

## 2022-10-08 DIAGNOSIS — E119 Type 2 diabetes mellitus without complications: Secondary | ICD-10-CM | POA: Diagnosis not present

## 2022-10-08 DIAGNOSIS — I10 Essential (primary) hypertension: Secondary | ICD-10-CM | POA: Diagnosis not present

## 2022-10-08 DIAGNOSIS — K5140 Inflammatory polyps of colon without complications: Secondary | ICD-10-CM | POA: Diagnosis not present

## 2022-10-08 DIAGNOSIS — D123 Benign neoplasm of transverse colon: Secondary | ICD-10-CM | POA: Diagnosis not present

## 2022-10-08 DIAGNOSIS — K5730 Diverticulosis of large intestine without perforation or abscess without bleeding: Secondary | ICD-10-CM | POA: Diagnosis not present

## 2022-10-08 DIAGNOSIS — D122 Benign neoplasm of ascending colon: Secondary | ICD-10-CM | POA: Diagnosis not present

## 2022-10-08 DIAGNOSIS — E785 Hyperlipidemia, unspecified: Secondary | ICD-10-CM | POA: Diagnosis not present

## 2022-10-09 DIAGNOSIS — E119 Type 2 diabetes mellitus without complications: Secondary | ICD-10-CM | POA: Diagnosis not present

## 2022-10-09 DIAGNOSIS — I10 Essential (primary) hypertension: Secondary | ICD-10-CM | POA: Diagnosis not present

## 2022-10-09 DIAGNOSIS — E669 Obesity, unspecified: Secondary | ICD-10-CM | POA: Diagnosis not present

## 2022-10-09 DIAGNOSIS — E785 Hyperlipidemia, unspecified: Secondary | ICD-10-CM | POA: Diagnosis not present

## 2022-10-13 DIAGNOSIS — G4733 Obstructive sleep apnea (adult) (pediatric): Secondary | ICD-10-CM | POA: Diagnosis not present

## 2022-10-15 DIAGNOSIS — E1122 Type 2 diabetes mellitus with diabetic chronic kidney disease: Secondary | ICD-10-CM | POA: Diagnosis not present

## 2022-10-15 DIAGNOSIS — R809 Proteinuria, unspecified: Secondary | ICD-10-CM | POA: Diagnosis not present

## 2022-10-15 DIAGNOSIS — N182 Chronic kidney disease, stage 2 (mild): Secondary | ICD-10-CM | POA: Diagnosis not present

## 2022-10-15 DIAGNOSIS — I129 Hypertensive chronic kidney disease with stage 1 through stage 4 chronic kidney disease, or unspecified chronic kidney disease: Secondary | ICD-10-CM | POA: Diagnosis not present

## 2022-11-04 ENCOUNTER — Encounter: Payer: Self-pay | Admitting: Cardiology

## 2022-11-04 DIAGNOSIS — N401 Enlarged prostate with lower urinary tract symptoms: Secondary | ICD-10-CM | POA: Diagnosis not present

## 2022-11-04 DIAGNOSIS — N138 Other obstructive and reflux uropathy: Secondary | ICD-10-CM | POA: Diagnosis not present

## 2022-11-13 DIAGNOSIS — G4733 Obstructive sleep apnea (adult) (pediatric): Secondary | ICD-10-CM | POA: Diagnosis not present

## 2022-11-18 ENCOUNTER — Encounter: Payer: Self-pay | Admitting: Cardiology

## 2022-12-13 DIAGNOSIS — G4733 Obstructive sleep apnea (adult) (pediatric): Secondary | ICD-10-CM | POA: Diagnosis not present

## 2023-01-07 DIAGNOSIS — G4733 Obstructive sleep apnea (adult) (pediatric): Secondary | ICD-10-CM | POA: Diagnosis not present

## 2023-01-12 DIAGNOSIS — E119 Type 2 diabetes mellitus without complications: Secondary | ICD-10-CM | POA: Diagnosis not present

## 2023-01-12 DIAGNOSIS — Z1159 Encounter for screening for other viral diseases: Secondary | ICD-10-CM | POA: Diagnosis not present

## 2023-01-13 DIAGNOSIS — E785 Hyperlipidemia, unspecified: Secondary | ICD-10-CM | POA: Diagnosis not present

## 2023-01-13 DIAGNOSIS — E663 Overweight: Secondary | ICD-10-CM | POA: Diagnosis not present

## 2023-01-13 DIAGNOSIS — I10 Essential (primary) hypertension: Secondary | ICD-10-CM | POA: Diagnosis not present

## 2023-01-13 DIAGNOSIS — E119 Type 2 diabetes mellitus without complications: Secondary | ICD-10-CM | POA: Diagnosis not present

## 2023-01-21 DIAGNOSIS — H524 Presbyopia: Secondary | ICD-10-CM | POA: Diagnosis not present

## 2023-01-21 DIAGNOSIS — H52223 Regular astigmatism, bilateral: Secondary | ICD-10-CM | POA: Diagnosis not present

## 2023-01-21 DIAGNOSIS — H2513 Age-related nuclear cataract, bilateral: Secondary | ICD-10-CM | POA: Diagnosis not present

## 2023-01-21 DIAGNOSIS — E119 Type 2 diabetes mellitus without complications: Secondary | ICD-10-CM | POA: Diagnosis not present

## 2023-01-27 DIAGNOSIS — M1711 Unilateral primary osteoarthritis, right knee: Secondary | ICD-10-CM | POA: Diagnosis not present

## 2023-01-27 DIAGNOSIS — M1712 Unilateral primary osteoarthritis, left knee: Secondary | ICD-10-CM | POA: Diagnosis not present

## 2023-01-27 DIAGNOSIS — M170 Bilateral primary osteoarthritis of knee: Secondary | ICD-10-CM | POA: Diagnosis not present

## 2023-01-27 DIAGNOSIS — M1611 Unilateral primary osteoarthritis, right hip: Secondary | ICD-10-CM | POA: Diagnosis not present

## 2023-01-30 ENCOUNTER — Other Ambulatory Visit: Payer: Self-pay | Admitting: Orthopedic Surgery

## 2023-01-30 DIAGNOSIS — M25551 Pain in right hip: Secondary | ICD-10-CM

## 2023-02-07 DIAGNOSIS — G4733 Obstructive sleep apnea (adult) (pediatric): Secondary | ICD-10-CM | POA: Diagnosis not present

## 2023-02-11 DIAGNOSIS — G4733 Obstructive sleep apnea (adult) (pediatric): Secondary | ICD-10-CM | POA: Diagnosis not present

## 2023-02-18 ENCOUNTER — Ambulatory Visit
Admission: RE | Admit: 2023-02-18 | Discharge: 2023-02-18 | Disposition: A | Discharge status: Home / Self Care | Payer: BC Managed Care – PPO | Source: Ambulatory Visit | Attending: Orthopedic Surgery | Admitting: Orthopedic Surgery

## 2023-02-18 DIAGNOSIS — M25551 Pain in right hip: Secondary | ICD-10-CM

## 2023-02-20 ENCOUNTER — Other Ambulatory Visit: Payer: BC Managed Care – PPO

## 2023-03-09 DIAGNOSIS — G4733 Obstructive sleep apnea (adult) (pediatric): Secondary | ICD-10-CM | POA: Diagnosis not present

## 2023-04-15 DIAGNOSIS — N182 Chronic kidney disease, stage 2 (mild): Secondary | ICD-10-CM | POA: Diagnosis not present

## 2023-04-23 DIAGNOSIS — I129 Hypertensive chronic kidney disease with stage 1 through stage 4 chronic kidney disease, or unspecified chronic kidney disease: Secondary | ICD-10-CM | POA: Diagnosis not present

## 2023-04-23 DIAGNOSIS — E1122 Type 2 diabetes mellitus with diabetic chronic kidney disease: Secondary | ICD-10-CM | POA: Diagnosis not present

## 2023-04-23 DIAGNOSIS — R809 Proteinuria, unspecified: Secondary | ICD-10-CM | POA: Diagnosis not present

## 2023-04-23 DIAGNOSIS — N182 Chronic kidney disease, stage 2 (mild): Secondary | ICD-10-CM | POA: Diagnosis not present

## 2023-04-27 DIAGNOSIS — E785 Hyperlipidemia, unspecified: Secondary | ICD-10-CM | POA: Diagnosis not present

## 2023-04-27 DIAGNOSIS — E119 Type 2 diabetes mellitus without complications: Secondary | ICD-10-CM | POA: Diagnosis not present

## 2023-04-27 DIAGNOSIS — I10 Essential (primary) hypertension: Secondary | ICD-10-CM | POA: Diagnosis not present

## 2023-04-28 ENCOUNTER — Other Ambulatory Visit: Payer: Self-pay

## 2023-04-29 DIAGNOSIS — E119 Type 2 diabetes mellitus without complications: Secondary | ICD-10-CM | POA: Diagnosis not present

## 2023-04-29 DIAGNOSIS — E785 Hyperlipidemia, unspecified: Secondary | ICD-10-CM | POA: Diagnosis not present

## 2023-04-29 DIAGNOSIS — I10 Essential (primary) hypertension: Secondary | ICD-10-CM | POA: Diagnosis not present

## 2023-04-29 DIAGNOSIS — M5080 Other cervical disc disorders, unspecified cervical region: Secondary | ICD-10-CM | POA: Diagnosis not present

## 2023-04-29 DIAGNOSIS — M542 Cervicalgia: Secondary | ICD-10-CM | POA: Diagnosis not present

## 2023-04-29 DIAGNOSIS — E669 Obesity, unspecified: Secondary | ICD-10-CM | POA: Diagnosis not present

## 2023-05-14 DIAGNOSIS — M5481 Occipital neuralgia: Secondary | ICD-10-CM | POA: Diagnosis not present

## 2023-07-27 IMAGING — US US RENAL
1 series · 14 of 25 positions shown · non-contrast
Comparison: None.

CLINICAL DATA: Proteinuria.

EXAM:
RENAL / URINARY TRACT ULTRASOUND COMPLETE

[Series 1: us renal · 0.25mm/px · 14 of 36 slices shown]
[im 1/36]
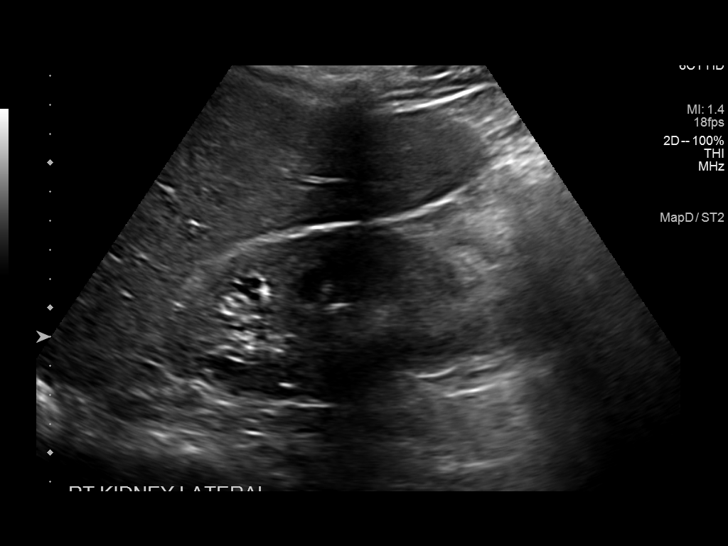
[im 3/36]
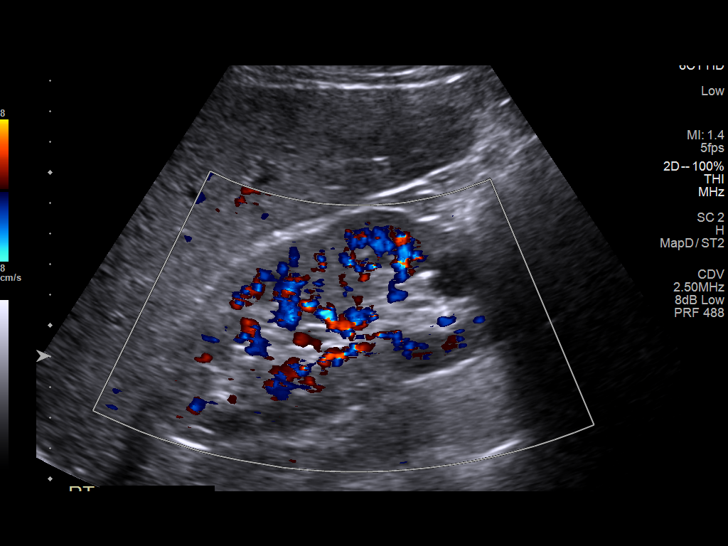
[im 6/36]
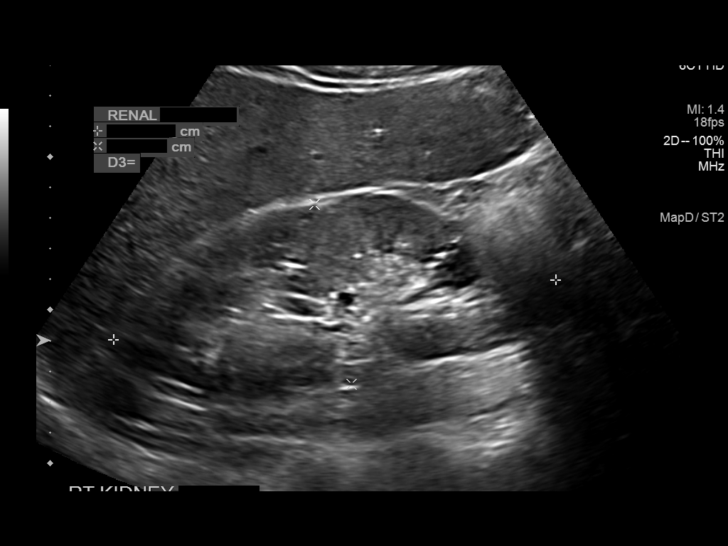
[im 9/36]
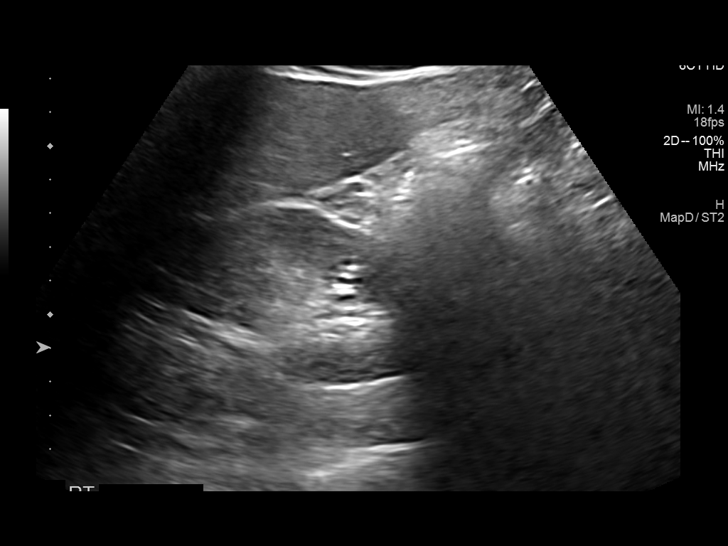
[im 12/36]
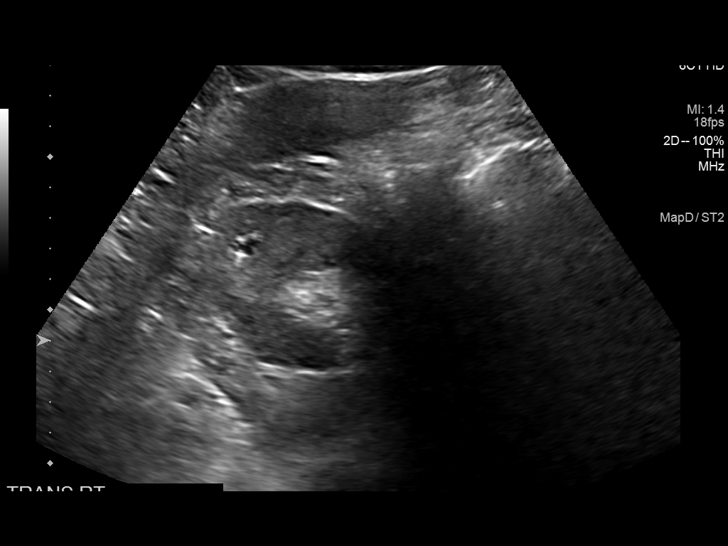
[im 14/36]
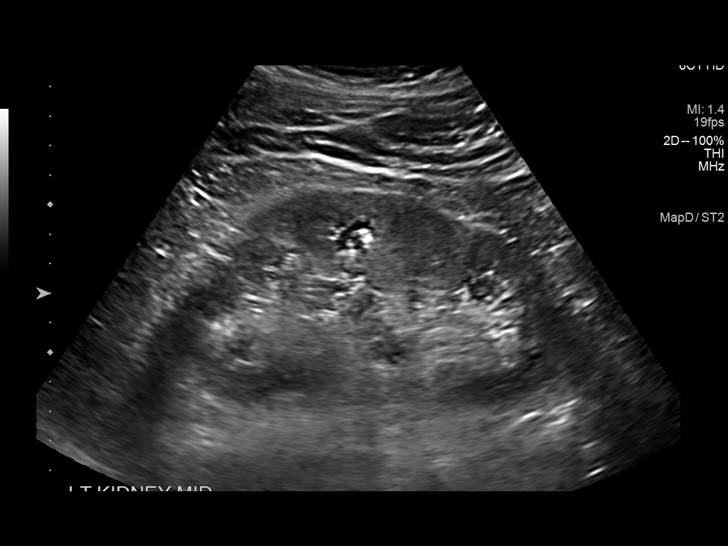
[im 17/36]
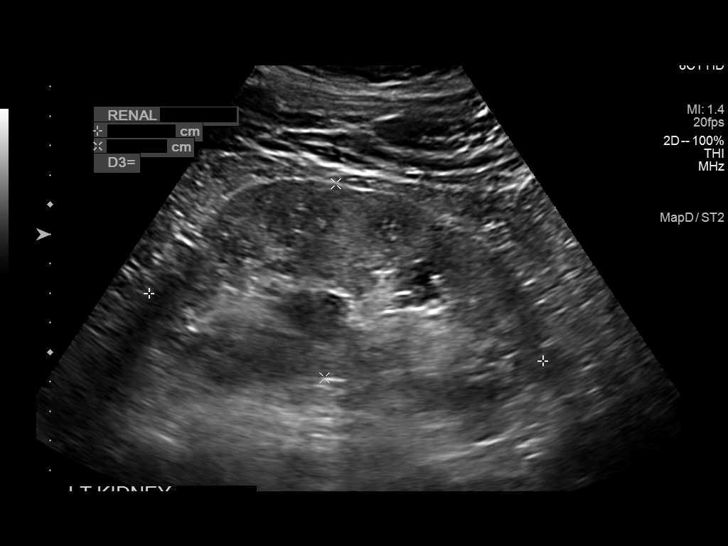
[im 19/36]
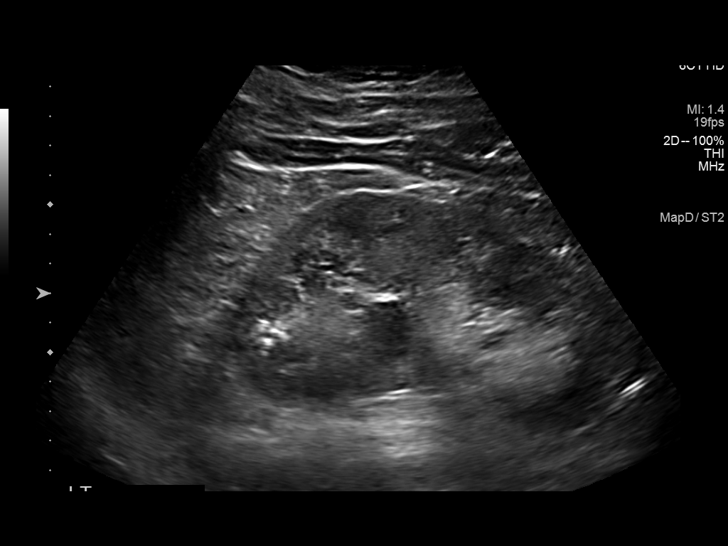
[im 22/36]
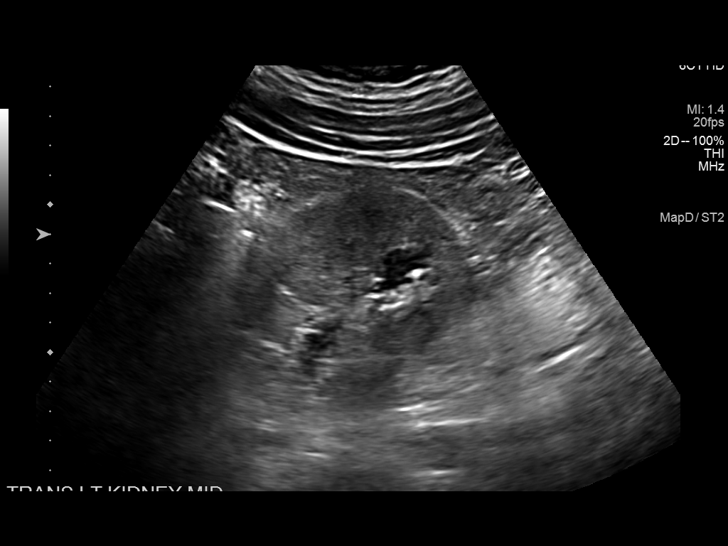
[im 24/36]
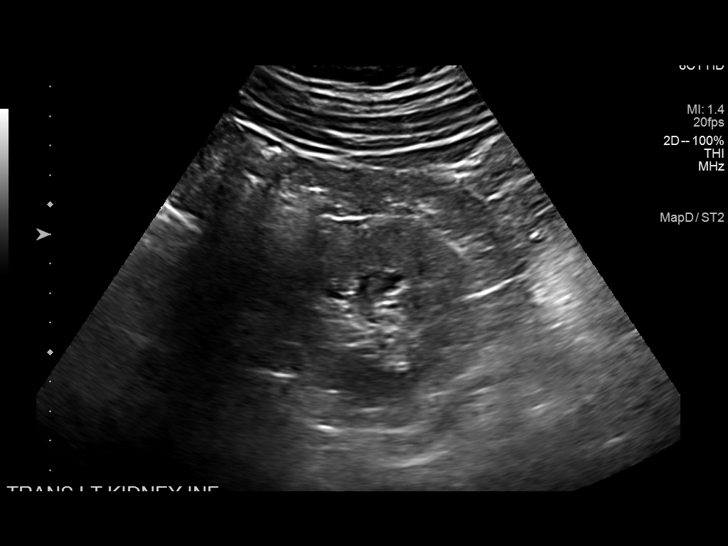
[im 27/36]
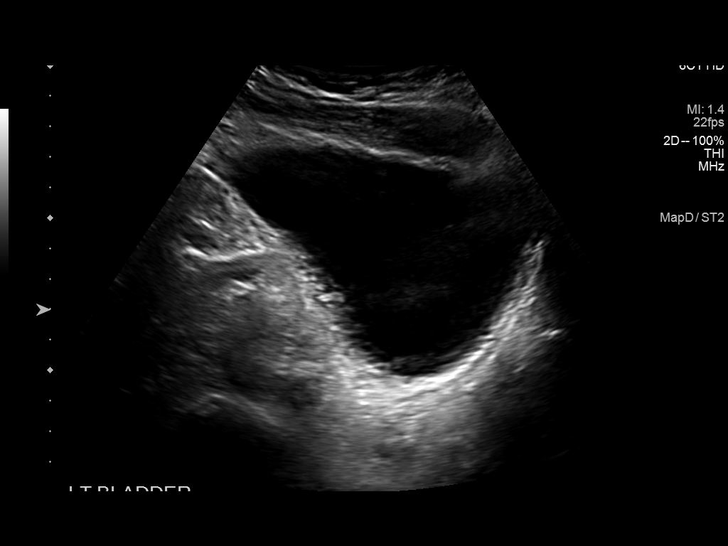
[im 30/36]
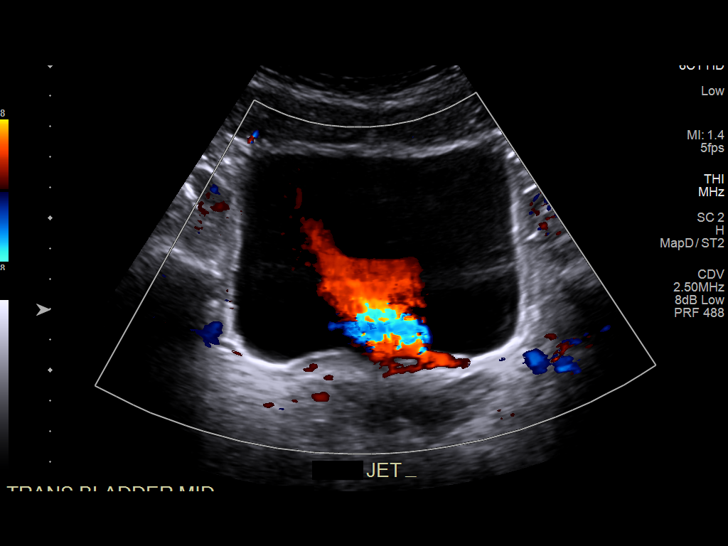
[im 33/36]
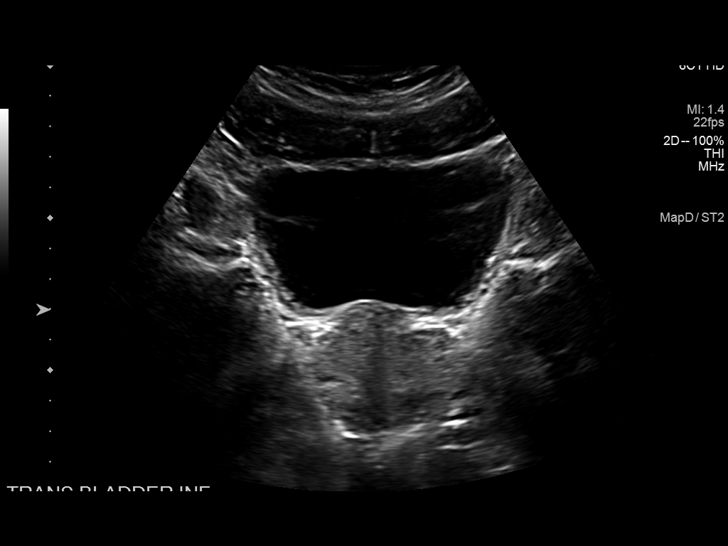
[im 36/36]
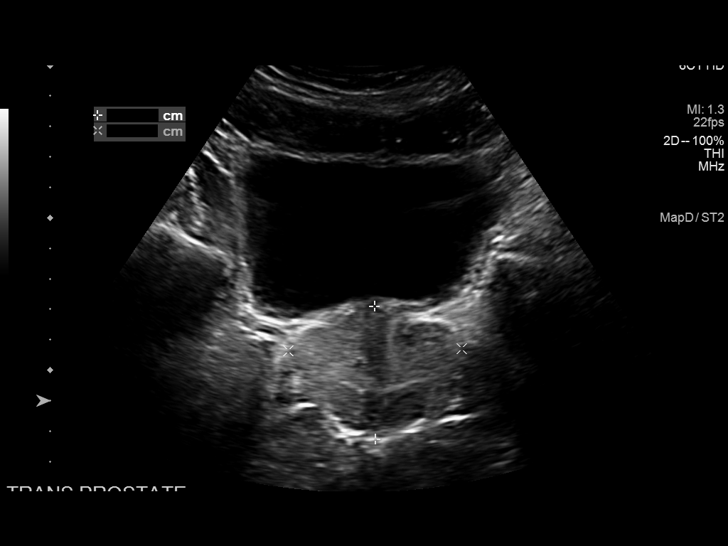

[14 of 25 positions shown; findings below may reference images not displayed]

FINDINGS: Right Kidney:

Renal measurements: 14.6 x 6.0 x 7.4 cm = volume: 338 mL.
Echogenicity within normal limits. No mass or hydronephrosis
visualized.

Left Kidney:

Renal measurements: 13.5 x 6.6 x 6.6 cm = volume: 307 mL.
Echogenicity within normal limits. No mass or hydronephrosis
visualized.

Bladder:

Appears normal for degree of bladder distention.

Other:

None.
IMPRESSION: Kidneys at the upper limits of normal in size without hydronephrosis
or focal mass.

## 2023-08-14 ENCOUNTER — Other Ambulatory Visit: Payer: Self-pay | Admitting: Internal Medicine

## 2023-08-14 MED ORDER — RIZATRIPTAN BENZOATE 10 MG PO TABS: 10.0000 mg | ORAL_TABLET | Freq: Once | ORAL | 2 refills | Status: DC | PRN

## 2023-09-23 ENCOUNTER — Ambulatory Visit: Payer: BC Managed Care – PPO | Attending: Cardiology | Admitting: Cardiology

## 2023-09-23 ENCOUNTER — Encounter: Payer: Self-pay | Admitting: Cardiology

## 2023-09-23 VITALS — BP 110/72 | HR 88 | Ht 71.5 in | Wt 210.0 lb

## 2023-09-23 DIAGNOSIS — F17200 Nicotine dependence, unspecified, uncomplicated: Secondary | ICD-10-CM | POA: Diagnosis not present

## 2023-09-23 DIAGNOSIS — G4733 Obstructive sleep apnea (adult) (pediatric): Secondary | ICD-10-CM | POA: Diagnosis not present

## 2023-09-23 DIAGNOSIS — E785 Hyperlipidemia, unspecified: Secondary | ICD-10-CM

## 2023-09-23 DIAGNOSIS — I10 Essential (primary) hypertension: Secondary | ICD-10-CM

## 2023-09-23 NOTE — Progress Notes (Unsigned)
 Cardiology Office Note:    Date:  09/23/2023   ID:  Caswell Hundertmark, DOB [birth year], MRN [id number]  PCP:  Jearldine Mina, MD  Cardiologist:  Ralene Burger, MD    Referring MD: Jearldine Mina, MD   Chief Complaint  Patient presents with   Follow-up    History of Present Illness:    Gerald Barrett is a 48 y.o. male past medical history significant for multiple resections of coronary artery disease which include hypertension smoking dyslipidemia diabetes.  Sadly he still continues to smoke, close to being well taken care of blood pressure seems to be good.  Comes today to months for follow-up, doing well.  Denies have any chest pain tightness squeezing pressure in chest.  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  Past Medical History:  Diagnosis Date   Abnormal LFTs (liver function tests) 08/27/2015   Hepatitis work-up negative 08/2015.   Allergic rhinitis 09/25/2016   Anemia    Thalasemia   Cervicalgia 09/25/2016   Diabetes mellitus type 2, uncontrolled, without complications 02/24/2017   Elevated transaminase level 09/25/2016   Fatigue    GERD (gastroesophageal reflux disease)    Hyperlipemia, mixed 09/25/2016   Hyperlipidemia    dyslipidemia-hyperTG   Hypertension    Hypertension, essential, benign 09/25/2016   OSA (obstructive sleep apnea)    last sleep study 09/2015   OSA on CPAP 09/17/2015   Severe obesity (BMI 35.0-39.9) with comorbidity (HCC) 09/25/2016   Tobacco use disorder     Past Surgical History:  Procedure Laterality Date   none      Current Medications: Current Meds  Medication Sig   amLODipine -benazepril (LOTREL) 5-10 MG capsule Take 1 capsule by mouth daily.   baclofen (LIORESAL) 10 MG tablet Take 5-10 mg by mouth 2 (two) times daily as needed.   celecoxib (CELEBREX) 200 MG capsule Take 200 mg by mouth daily as needed for moderate pain. Take 200 mg by mouth daily as needed.   diclofenac (VOLTAREN) 75 MG EC tablet TAKE 1 TABLET (75 MG) BY  MOUTH TWICE A DAY (Patient taking differently: Take 75 mg by mouth 2 (two) times daily.)   FARXIGA 10 MG TABS tablet TAKE 1 TABLET BY MOUTH EVERY MORNING   FLUoxetine (PROZAC) 10 MG capsule Take 10 mg by mouth daily.   GEMTESA 75 MG TABS Take 1 tablet by mouth daily.   metFORMIN  (GLUCOPHAGE ) 500 MG tablet TAKE 1 TABLET BY MOUTH WITH BREAKFAST AND TAKE 2 TABLETS BY MOUTH AT SUPPER. (Patient taking differently: Take 500 mg by mouth See admin instructions. Take 1 tablet by mouth with breakfast and take 2 tablets by mouth at supper.)   omeprazole (PRILOSEC) 40 MG capsule Take 40 mg by mouth daily as needed (Indigestion). Take 40 mg by mouth daily as needed.   oxyCODONE (OXY IR/ROXICODONE) 5 MG immediate release tablet Take 5 mg by mouth as needed for moderate pain (pain score 4-6) or severe pain (pain score 7-10).   OZEMPIC, 1 MG/DOSE, 4 MG/3ML SOPN Inject 1 mg into the skin once a week.   PARoxetine (PAXIL) 20 MG tablet Take 20 mg by mouth every morning.   pregabalin (LYRICA) 75 MG capsule Take 75 mg by mouth daily.   rizatriptan  (MAXALT ) 10 MG tablet Take 1 tablet (10 mg total) by mouth once as needed for migraine. May repeat in 2 hours if needed   rosuvastatin  (CRESTOR ) 20 MG tablet TAKE 1 TABLET BY MOUTH EVERY DAY (Patient taking differently: Take 20 mg by mouth  daily.)   [DISCONTINUED] Semaglutide,0.25 or 0.5MG /DOS, (OZEMPIC, 0.25 OR 0.5 MG/DOSE,) 2 MG/3ML SOPN INJECT 0.25 MG INTO THE SKIN ONCE WEEKLY X 4 WEEKS ,THEN INJECT 0.5 MG ONCE WEEKLY FOR 2 WEEKS (Patient taking differently: Inject 0.25 mg into the skin once a week.)   [DISCONTINUED] tadalafil (CIALIS) 20 MG tablet Take 20 mg by mouth daily as needed.     Allergies:   Alfuzosin, Codeine, and Oxycodone   Social History   Socioeconomic History   Marital status: Married    Spouse name: Not on file   Number of children: Not on file   Years of education: Not on file   Highest education level: Not on file  Occupational History   Not on  file  Tobacco Use   Smoking status: Every Day   Smokeless tobacco: Never  Vaping Use   Vaping status: Every Day  Substance and Sexual Activity   Alcohol use: No   Drug use: No   Sexual activity: Not on file  Other Topics Concern   Not on file  Social History Narrative   Not on file   Social Drivers of Health   Financial Resource Strain: Not on file  Food Insecurity: No Food Insecurity (01/28/2021)   Received from [facility], [facility]   Hunger Vital Sign    Worried About Running Out of Food in the Last Year: Never true    Ran Out of Food in the Last Year: Never true  Transportation Needs: Not on file  Physical Activity: Not on file  Stress: Not on file  Social Connections: Unknown (10/15/2021)   Received from [facility], [facility]   Social Network    Social Network: Not on file     Family History: The patient's family history includes Cancer in his father; Hepatitis C in his father and mother. ROS:   Please see the history of present illness.    All 14 point review of systems negative except as described per history of present illness  EKGs/Labs/Other Studies Reviewed:    EKG Interpretation Date/Time:  Wednesday September 23 2023 11:26:46 EDT Ventricular Rate:  84 PR Interval:  142 QRS Duration:  88 QT Interval:  378 QTC Calculation: 446 R Axis:   -37  Text Interpretation: Normal sinus rhythm Left axis deviation Pulmonary disease pattern No previous ECGs available Confirmed by Ralene Burger [phone]) on 09/23/2023 11:54:32 AM    Recent Labs: No results found for requested labs within last 365 days.  Recent Lipid Panel    Component Value Date/Time   CHOL 164 09/26/2016 0931   TRIG (H) 09/26/2016 0931    446.0 Triglyceride is over 400; calculations on Lipids are invalid.   HDL 20.10 (L) 09/26/2016 0931   CHOLHDL 8 09/26/2016 0931   LDLDIRECT 95.0 09/26/2016 0931    Physical Exam:    VS:  BP 110/72 (BP Location: Right Arm, Patient Position:  Sitting)   Pulse 88   Ht 5' 11.5" (1.816 m)   Wt 210 lb (95.3 kg)   SpO2 97%   BMI 28.88 kg/m     Wt Readings from Last 3 Encounters:  09/23/23 210 lb (95.3 kg)  07/09/22 226 lb (102.5 kg)  07/31/20 245 lb (111.1 kg)     GEN:  Well nourished, well developed in no acute distress HEENT: Normal NECK: No JVD; No carotid bruits LYMPHATICS: No lymphadenopathy CARDIAC: RRR, no murmurs, no rubs, no gallops RESPIRATORY:  Clear to auscultation without rales, wheezing or rhonchi  ABDOMEN:  Soft, non-tender, non-distended MUSCULOSKELETAL:  No edema; No deformity  SKIN: Warm and dry LOWER EXTREMITIES: no swelling NEUROLOGIC:  Alert and oriented x 3 PSYCHIATRIC:  Normal affect   ASSESSMENT:    1. Hypertension, essential, benign   2. OSA on CPAP   3. Tobacco use disorder   4. Dyslipidemia    PLAN:    In order of problems listed above:  Essential hypertension blood pressure doing well continue present management. Obstructive sleep apnea: Followed by internal medicine team. Tobacco use we did spend some time talking about this I strongly recommend to quit. Dyslipidemia decrease K PN show me his HDL of 32, LDL 56.  Will continue present management which include Crestor  20. Diabetes well-controlled   Medication Adjustments/Labs and Tests Ordered: Current medicines are reviewed at length with the patient today.  Concerns regarding medicines are outlined above.  Orders Placed This Encounter  Procedures   EKG 12-Lead   Medication changes: No orders of the defined types were placed in this encounter.   Signed, Manfred Seed, MD, [facility] 09/23/2023 12:08 PM    [facility]

## 2023-09-23 NOTE — Patient Instructions (Signed)
 Medication Instructions:  Your physician recommends that you continue on your current medications as directed. Please refer to the Current Medication list given to you today.  *If you need a refill on your cardiac medications before your next appointment, please call your pharmacy*  Lab Work: None If you have labs (blood work) drawn today and your tests are completely normal, you will receive your results only by: MyChart Message (if you have MyChart) OR A paper copy in the mail If you have any lab test that is abnormal or we need to change your treatment, we will call you to review the results.  Testing/Procedures: None  Follow-Up: At [facility], you and your health needs are our priority.  As part of our continuing mission to provide you with exceptional heart care, our providers are all part of one team.  This team includes your primary Cardiologist (physician) and Advanced Practice Providers or APPs (Physician Assistants and Nurse Practitioners) who all work together to provide you with the care you need, when you need it.  Your next appointment:   1 year(s)  Provider:   Ralene Burger, MD    We recommend signing up for the patient portal called "MyChart".  Sign up information is provided on this After Visit Summary.  MyChart is used to connect with patients for Virtual Visits (Telemedicine).  Patients are able to view lab/test results, encounter notes, upcoming appointments, etc.  Non-urgent messages can be sent to your provider as well.   To learn more about what you can do with MyChart, go to [email or web address].   Other Instructions None

## 2023-10-01 ENCOUNTER — Other Ambulatory Visit: Payer: Self-pay | Admitting: Internal Medicine

## 2023-10-29 ENCOUNTER — Other Ambulatory Visit: Payer: Self-pay | Admitting: Internal Medicine

## 2023-12-14 ENCOUNTER — Encounter: Payer: Self-pay | Admitting: Cardiology

## 2024-02-18 ENCOUNTER — Ambulatory Visit (HOSPITAL_BASED_OUTPATIENT_CLINIC_OR_DEPARTMENT_OTHER): Payer: Self-pay | Admitting: Psychiatry

## 2024-02-18 ENCOUNTER — Encounter (HOSPITAL_COMMUNITY): Payer: Self-pay | Admitting: Psychiatry

## 2024-02-18 ENCOUNTER — Other Ambulatory Visit: Payer: Self-pay

## 2024-02-18 VITALS — BP 136/87 | HR 92 | Ht 72.0 in | Wt 220.0 lb

## 2024-02-18 DIAGNOSIS — F5101 Primary insomnia: Secondary | ICD-10-CM

## 2024-02-18 DIAGNOSIS — F411 Generalized anxiety disorder: Secondary | ICD-10-CM | POA: Diagnosis not present

## 2024-02-18 MED ORDER — MIRTAZAPINE 15 MG PO TABS: 15.0000 mg | ORAL_TABLET | Freq: Every day | ORAL | 0 refills | Status: DC

## 2024-02-18 NOTE — Progress Notes (Signed)
 Psychiatric Initial Adult Assessment   Patient Identification: Gerald Barrett MRN:  [id number] Date of Evaluation:  02/18/2024 Referral Source: Self Chief Complaint:   Chief Complaint  Patient presents with   Anxiety   Stress    insomnia   Visit Diagnosis:    ICD-10-CM   1. GAD (generalized anxiety disorder)  F41.1 mirtazapine  (REMERON ) 15 MG tablet    2. Primary insomnia  F51.01 mirtazapine  (REMERON ) 15 MG tablet      History of Present Illness: Patient is 48 year old [place] American employed married man who is self-referred for seeking help for his anxiety and insomnia.  Patient has history of diabetes, hypertension, hyperlipidemia and sleep apnea.  He is seen by sleep physician and prescribed Lunesta and is also taking over-the-counter Unisom and melatonin but not getting enough sleep.  He also reported family stress and anxiety may have triggered the symptoms.  Patient lives with his wife but he has a daughter and a son who lives in [place] and taken care by his brother.  Now his brother is coming to [place]  on the immigration visa and is very worried about his children.  His children has not received the immigration visa.  He has another brother and father who lives in [place] but he mostly rely on his other brother who take care of his children.  Patient worried that who will take care of his children once his brother moved to [place] .  His long-term plan is to go back to [place] but at the moment he cannot wrap up things here.  He also reported some stress from the work.  He manages hotel administration.  Patient has a sleep study and given the diagnosis of sleep apnea.  He also reported symptoms of narcolepsy and prescribed Sunosi 150 mg.  However he noticed starting taking that medication caused more insomnia so he decided to stop.  He denies any hallucination, paranoia, suicidal thoughts.  He denies any weight loss, hopelessness but reported sad mood, nervous and  anxiety.  He reported racing thoughts and sometimes gets emotional.  He also reported some time irritability and mood swings but denies any mania.  He reported having intrusive thoughts about his family makes him more restless at night.  He was given Zoloft, Paxil, Prozac by urologist for his delayed ejaculation however he stopped taking all these medications for now.  He reported taking dutasteride on the weekend helps but he like to try the medication that can help his anxiety and insomnia.  He does not want to take antidepressant to help delete ejaculation and then another medication to help his insomnia.  Associated Signs/Symptoms: Depression Symptoms:  depressed mood, insomnia, anxiety, disturbed sleep, (Hypo) Manic Symptoms:  Irritable Mood, Anxiety Symptoms:  Excessive Worry, Psychotic Symptoms:  no psychosis PTSD Symptoms: NA  Past Psychiatric History: No history of suicidal attempt, inpatient psychiatric treatment.  Reported anxiety due to family stress.  Given Prozac, Zoloft and Paxil from neurology for delayed ejaculation.  He tried melatonin, Unisom, Lunesta for insomnia.  Had a sleep study and diagnosed sleep apnea but also symptoms consistent with narcolepsy and prescribed Sunosi 150 mg by sleep physician.  No history of mania, psychosis.  No history of drug use.  Previous Psychotropic Medications: Yes   Substance Abuse History in the last 12 months:  No.  Consequences of Substance Abuse: NA  Past Medical History:  Past Medical History:  Diagnosis Date   Abnormal LFTs (liver function tests) 08/27/2015   Hepatitis work-up negative 08/2015.  Allergic rhinitis 09/25/2016   Anemia    Thalasemia   Cervicalgia 09/25/2016   Diabetes mellitus type 2, uncontrolled, without complications 02/24/2017   Elevated transaminase level 09/25/2016   Fatigue    GERD (gastroesophageal reflux disease)    Hyperlipemia, mixed 09/25/2016   Hyperlipidemia    dyslipidemia-hyperTG   Hypertension     Hypertension, essential, benign 09/25/2016   OSA (obstructive sleep apnea)    last sleep study 09/2015   OSA on CPAP 09/17/2015   Severe obesity (BMI 35.0-39.9) with comorbidity (HCC) 09/25/2016   Tobacco use disorder     Past Surgical History:  Procedure Laterality Date   none      Family Psychiatric History: family history of insomnia  Family History:  Family History  Problem Relation Age of Onset   Cancer Father        prostate   Hepatitis C Father    Hepatitis C Mother     Social History:   Social History   Socioeconomic History   Marital status: Married    Spouse name: Not on file   Number of children: Not on file   Years of education: Not on file   Highest education level: Not on file  Occupational History   Not on file  Tobacco Use   Smoking status: Every Day   Smokeless tobacco: Never  Vaping Use   Vaping status: Every Day  Substance and Sexual Activity   Alcohol use: No   Drug use: No   Sexual activity: Not on file  Other Topics Concern   Not on file  Social History Narrative   Not on file   Social Drivers of Health   Financial Resource Strain: Not on file  Food Insecurity: No Food Insecurity (01/28/2021)   [facility]   Hunger Vital Sign    Within the past 12 months, you worried that your food would run out before you got the money to buy more.: Never true    Within the past 12 months, the food you bought just didn't last and you didn't have money to get more.: Never true  Transportation Needs: Not on file  Physical Activity: Not on file  Stress: Not on file  Social Connections: Unknown (10/15/2021)   Received from [facility]   Social Network    Social Network: Not on file    Additional Social History: Patient born and raised in [place].  Did his MBA education from [place].  He is married and lives with his wife.  He has a 50-year-old son and 48-year-old daughter who lives in [place] and currently taking care of by his  younger brother.  His mother is deceased.  Father lives in [place].  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    Allergies:   Allergies  Allergen Reactions   Alfuzosin Other (See Comments)    Feeling of passing out, dizziness   Codeine Nausea Only   Oxycodone     Metabolic Disorder Labs: Lab Results  Component Value Date   HGBA1C 8.8 (H) 09/26/2016   No results found for: PROLACTIN Lab Results  Component Value Date   CHOL 164 09/26/2016   TRIG (H) 09/26/2016    446.0 Triglyceride is over 400; calculations on Lipids are invalid.   HDL 20.10 (L) 09/26/2016   CHOLHDL 8 09/26/2016   Lab Results  Component Value Date   TSH 1.93 09/26/2016    Therapeutic Level Labs: No results found for: LITHIUM  No results found for: CBMZ No results found for: VALPROATE  Current Medications: Current Outpatient Medications  Medication Sig Dispense Refill   amLODipine -benazepril (LOTREL) 5-10 MG capsule Take 1 capsule by mouth daily.     FARXIGA 10 MG TABS tablet TAKE 1 TABLET BY MOUTH EVERY MORNING 30 tablet 5   GEMTESA 75 MG TABS Take 1 tablet by mouth daily.     omeprazole (PRILOSEC) 40 MG capsule Take 40 mg by mouth daily as needed (Indigestion). Take 40 mg by mouth daily as needed.  2   oxyCODONE (OXY IR/ROXICODONE) 5 MG immediate release tablet Take 5 mg by mouth as needed for moderate pain (pain score 4-6) or severe pain (pain score 7-10).     OZEMPIC, 1 MG/DOSE, 4 MG/3ML SOPN Inject 1 mg into the skin once a week.     rizatriptan  (MAXALT ) 10 MG tablet Take 1 tablet (10 mg total) by mouth once as needed for migraine. May repeat in 2 hours if needed 30 tablet 2   rosuvastatin  (CRESTOR ) 20 MG tablet TAKE 1 TABLET BY MOUTH EVERY DAY 90 tablet 0   baclofen (LIORESAL) 10 MG tablet Take 5-10 mg by mouth 2 (two) times daily as needed. (Patient not taking: Reported on 02/18/2024)     celecoxib (CELEBREX) 200 MG capsule Take 200 mg by mouth  daily as needed for moderate pain. Take 200 mg by mouth daily as needed. (Patient not taking: Reported on 02/18/2024)  9   diclofenac (VOLTAREN) 75 MG EC tablet TAKE 1 TABLET (75 MG) BY MOUTH TWICE A DAY (Patient taking differently: Take 75 mg by mouth 2 (two) times daily.) 60 tablet 1   FLUoxetine (PROZAC) 10 MG capsule Take 10 mg by mouth daily. (Patient not taking: Reported on 02/18/2024)     metFORMIN  (GLUCOPHAGE ) 500 MG tablet TAKE 1 TABLET BY MOUTH WITH BREAKFAST AND TAKE 2 TABLETS BY MOUTH AT SUPPER. (Patient taking differently: Take 500 mg by mouth See admin instructions. Take 1 tablet by mouth with breakfast and take 2 tablets by mouth at supper.) 270 tablet 0   PARoxetine (PAXIL) 20 MG tablet Take 20 mg by mouth every morning. (Patient not taking: Reported on 02/18/2024)     pregabalin (LYRICA) 75 MG capsule Take 75 mg by mouth daily. (Patient not taking: Reported on 02/18/2024)     No current facility-administered medications for this visit.    Musculoskeletal: Strength & Muscle Tone: within normal limits Gait & Station: normal Patient leans: N/A  Psychiatric Specialty Exam: Review of Systems  Blood pressure 136/87, pulse 92, height 6' (1.829 m), weight 220 lb (99.8 kg).Body mass index is 29.84 kg/m.  General Appearance: Casual  Eye Contact:  Good  Speech:  Clear and Coherent  Volume:  Normal  Mood:  Anxious and Dysphoric  Affect:  Congruent  Thought Process:  Goal Directed  Orientation:  Full (Time, Place, and Person)  Thought Content:  Rumination  Suicidal Thoughts:  No  Homicidal Thoughts:  No  Memory:  Immediate;   Good Recent;   Good Remote;   Good  Judgement:  Intact  Insight:  Present  Psychomotor Activity:  Normal  Concentration:  Concentration: Good and Attention Span: Good  Recall:  Good  Fund of Knowledge:Good  Language: Good  Akathisia:  No  Handed:  Right  AIMS (if indicated):  not done  Assets:  Communication Skills Desire for  Improvement Housing Resilience Social Support Transportation  ADL's:  Intact  Cognition: WNL  Sleep:  Poor  Screenings: GAD-7    Flowsheet Row Office Visit [facility] 02/18/2024 in [facility]  Total GAD-7 Score 11   PHQ2-9    Flowsheet Row Office Visit from 02/18/2024 in [facility]  PHQ-2 Total Score 2  PHQ-9 Total Score 5   Flowsheet Row Office Visit from 02/18/2024 in [facility]  C-SSRS RISK CATEGORY No Risk    Assessment and Plan: Patient is 48 year old [place] American married, employed man with history of hypertension, diabetes, hyperlipidemia, sleep apnea and now have anxiety and insomnia.  Reviewed collateral information and current medication.  Reviewed past antidepressant medication which he mention took for delayed ejaculation from urology and over-the-counter medication for sleep.  Currently he is prescribed Lunesta 2 mg which he only takes on the weekend but like to take something on a regular basis that can help his anxiety and insomnia.  Discussed to try low-dose mirtazapine  that can help his anxiety and sleep.  He had not tried before.  Patient willing to give it a try.  He is going to [place] in October but will come back within a month.  He reported long-term plan is to move back to [place] if he could not find any help or other options to take care of his 103-year-old son and 88-year-old daughter.  He had applied the visa for them but is still waiting.  Discussed sleep hygiene in detail.  Discussed medication side effects and benefits.  Recommend to call back if is any question or any concern.  Follow-up as needed.  Collaboration of Care: Other provider involved in patient's care AEB notes are available in epic to review  Patient/Guardian was advised Release of Information must be obtained prior to any record release in order to collaborate their care with  an outside provider. Patient/Guardian was advised if they have not already done so to contact the registration department to sign all necessary forms in order for us  to release information regarding their care.   Consent: Patient/Guardian gives verbal consent for treatment and assignment of benefits for services provided during this visit. Patient/Guardian expressed understanding and agreed to proceed.   Leni ONEIDA Client, MD 9/18/202512:19 PM

## 2024-03-15 ENCOUNTER — Other Ambulatory Visit (HOSPITAL_COMMUNITY): Payer: Self-pay | Admitting: Psychiatry

## 2024-03-15 DIAGNOSIS — F411 Generalized anxiety disorder: Secondary | ICD-10-CM

## 2024-03-15 DIAGNOSIS — F5101 Primary insomnia: Secondary | ICD-10-CM

## 2024-03-25 ENCOUNTER — Encounter (HOSPITAL_COMMUNITY): Payer: Self-pay

## 2024-03-25 DIAGNOSIS — F5101 Primary insomnia: Secondary | ICD-10-CM

## 2024-03-25 DIAGNOSIS — F411 Generalized anxiety disorder: Secondary | ICD-10-CM

## 2024-03-25 MED ORDER — MIRTAZAPINE 15 MG PO TABS: 15.0000 mg | ORAL_TABLET | Freq: Every day | ORAL | 0 refills | Status: AC

## 2024-03-25 NOTE — Telephone Encounter (Signed)
 Requested refill.   Done.

## 2024-03-28 ENCOUNTER — Other Ambulatory Visit: Payer: Self-pay | Admitting: Internal Medicine
